# Patient Record
Sex: Female | Born: 1950
Health system: Southern US, Community
[De-identification: ages and names within clinical notes are randomized; demographics above are authoritative.]

## PROBLEM LIST (undated history)

## (undated) DIAGNOSIS — F329 Major depressive disorder, single episode, unspecified: Secondary | ICD-10-CM

## (undated) DIAGNOSIS — F32A Depression, unspecified: Secondary | ICD-10-CM

## (undated) DIAGNOSIS — I10 Essential (primary) hypertension: Secondary | ICD-10-CM

## (undated) HISTORY — DX: Depression, unspecified: F32.A

## (undated) HISTORY — PX: OVARIAN CYST REMOVAL: SHX89

## (undated) HISTORY — DX: Essential (primary) hypertension: I10

## (undated) HISTORY — PX: OTHER SURGICAL HISTORY: SHX169

## (undated) HISTORY — DX: Major depressive disorder, single episode, unspecified: F32.9

---

## 2003-03-31 ENCOUNTER — Other Ambulatory Visit: Payer: Self-pay

## 2004-10-21 ENCOUNTER — Ambulatory Visit: Payer: Self-pay | Admitting: Obstetrics and Gynecology

## 2005-08-08 ENCOUNTER — Other Ambulatory Visit: Payer: Self-pay

## 2005-08-08 ENCOUNTER — Inpatient Hospital Stay: Payer: Self-pay | Admitting: Internal Medicine

## 2005-08-20 ENCOUNTER — Ambulatory Visit: Payer: Self-pay | Admitting: Specialist

## 2005-10-06 ENCOUNTER — Ambulatory Visit: Payer: Self-pay | Admitting: Family Medicine

## 2005-10-27 ENCOUNTER — Ambulatory Visit: Payer: Self-pay | Admitting: Obstetrics and Gynecology

## 2006-10-22 ENCOUNTER — Ambulatory Visit: Payer: Self-pay | Admitting: Obstetrics and Gynecology

## 2007-10-26 ENCOUNTER — Ambulatory Visit: Payer: Self-pay | Admitting: Obstetrics and Gynecology

## 2007-12-24 ENCOUNTER — Ambulatory Visit: Payer: Self-pay | Admitting: Family Medicine

## 2008-11-13 ENCOUNTER — Ambulatory Visit: Payer: Self-pay | Admitting: Obstetrics and Gynecology

## 2009-11-15 ENCOUNTER — Ambulatory Visit: Payer: Self-pay | Admitting: Obstetrics and Gynecology

## 2010-11-21 ENCOUNTER — Ambulatory Visit: Payer: Self-pay | Admitting: Obstetrics and Gynecology

## 2011-09-27 ENCOUNTER — Ambulatory Visit: Payer: Self-pay | Admitting: Family Medicine

## 2012-04-20 ENCOUNTER — Ambulatory Visit: Payer: Self-pay | Admitting: Obstetrics and Gynecology

## 2013-03-12 ENCOUNTER — Ambulatory Visit: Payer: Self-pay | Admitting: Physician Assistant

## 2013-05-04 ENCOUNTER — Ambulatory Visit: Payer: Self-pay | Admitting: Obstetrics and Gynecology

## 2013-09-05 DIAGNOSIS — I10 Essential (primary) hypertension: Secondary | ICD-10-CM | POA: Insufficient documentation

## 2013-09-05 DIAGNOSIS — R091 Pleurisy: Secondary | ICD-10-CM | POA: Insufficient documentation

## 2014-08-24 ENCOUNTER — Other Ambulatory Visit: Payer: Self-pay

## 2014-08-24 DIAGNOSIS — F32A Depression, unspecified: Secondary | ICD-10-CM

## 2014-08-24 DIAGNOSIS — F329 Major depressive disorder, single episode, unspecified: Secondary | ICD-10-CM

## 2014-08-24 DIAGNOSIS — I10 Essential (primary) hypertension: Secondary | ICD-10-CM

## 2014-08-24 MED ORDER — HYDROCHLOROTHIAZIDE 12.5 MG PO TABS
12.5000 mg | ORAL_TABLET | Freq: Every day | ORAL | Status: DC
Start: 2014-08-24 — End: 2014-08-28

## 2014-08-24 MED ORDER — SERTRALINE HCL 50 MG PO TABS: 50.0000 mg | ORAL_TABLET | Freq: Every day | ORAL | Status: DC

## 2014-08-24 MED ORDER — AMLODIPINE BESY-BENAZEPRIL HCL 5-10 MG PO CAPS: 1.0000 | ORAL_CAPSULE | Freq: Every day | ORAL | Status: DC

## 2014-08-25 DIAGNOSIS — I10 Essential (primary) hypertension: Secondary | ICD-10-CM | POA: Insufficient documentation

## 2014-08-25 DIAGNOSIS — F329 Major depressive disorder, single episode, unspecified: Secondary | ICD-10-CM | POA: Insufficient documentation

## 2014-08-25 DIAGNOSIS — F32A Depression, unspecified: Secondary | ICD-10-CM | POA: Insufficient documentation

## 2014-08-28 ENCOUNTER — Ambulatory Visit (INDEPENDENT_AMBULATORY_CARE_PROVIDER_SITE_OTHER): Payer: BC Managed Care – PPO | Admitting: Family Medicine

## 2014-08-28 ENCOUNTER — Encounter (INDEPENDENT_AMBULATORY_CARE_PROVIDER_SITE_OTHER): Payer: Self-pay

## 2014-08-28 ENCOUNTER — Encounter: Payer: Self-pay | Admitting: Family Medicine

## 2014-08-28 VITALS — BP 130/88 | HR 80 | Ht 64.0 in | Wt 210.0 lb

## 2014-08-28 DIAGNOSIS — I10 Essential (primary) hypertension: Secondary | ICD-10-CM

## 2014-08-28 DIAGNOSIS — F32A Depression, unspecified: Secondary | ICD-10-CM

## 2014-08-28 DIAGNOSIS — F329 Major depressive disorder, single episode, unspecified: Secondary | ICD-10-CM

## 2014-08-28 DIAGNOSIS — L409 Psoriasis, unspecified: Secondary | ICD-10-CM | POA: Insufficient documentation

## 2014-08-28 DIAGNOSIS — J302 Other seasonal allergic rhinitis: Secondary | ICD-10-CM | POA: Insufficient documentation

## 2014-08-28 MED ORDER — AMLODIPINE BESY-BENAZEPRIL HCL 5-10 MG PO CAPS: 1.0000 | ORAL_CAPSULE | Freq: Every day | ORAL | Status: DC

## 2014-08-28 MED ORDER — SERTRALINE HCL 50 MG PO TABS: 50.0000 mg | ORAL_TABLET | Freq: Every day | ORAL | Status: DC

## 2014-08-28 MED ORDER — HYDROCHLOROTHIAZIDE 12.5 MG PO TABS: 12.5000 mg | ORAL_TABLET | Freq: Every day | ORAL | Status: DC

## 2014-08-28 NOTE — Progress Notes (Signed)
Name: Vanessa Alvarado   MRN: [id number]    DOB: 1950-06-25   Date:08/28/2014       Progress Note  Subjective  Chief Complaint  Chief Complaint  Patient presents with  . Hypertension    needs refills  . Depression    Hypertension This is a chronic problem. The current episode started more than 1 year ago. The problem is unchanged (controlled). The problem is controlled. Pertinent negatives include no anxiety, blurred vision, chest pain, headaches, malaise/fatigue, neck pain, orthopnea, palpitations, peripheral edema, PND, shortness of breath or sweats. There are no associated agents to hypertension. There are no known risk factors for coronary artery disease. Past treatments include ACE inhibitors, calcium channel blockers and diuretics. The current treatment provides moderate improvement. There are no compliance problems.  There is no history of angina, kidney disease, CAD/MI, CVA, heart failure, left ventricular hypertrophy, PVD or retinopathy. There is no history of chronic renal disease.  Mental Health Problem The primary symptoms do not include dysphoric mood, delusions, hallucinations, bizarre behavior, disorganized speech, negative symptoms or somatic symptoms. The current episode started more than 1 month ago. This is a chronic problem.  The degree of incapacity that she is experiencing as a consequence of her illness is mild. Additional symptoms of the illness do not include no anhedonia, no insomnia, no hypersomnia, no fatigue, no agitation, no psychomotor retardation, no feelings of worthlessness, no attention impairment, no euphoric mood, no headaches or no abdominal pain. She does not admit to suicidal ideas. She does not have a plan to commit suicide. She does not contemplate harming herself.    No problem-specific assessment & plan notes found for this encounter.   Past Medical History  Diagnosis Date  . Depression   . Hypertension     Past Surgical History  Procedure  Laterality Date  . Ovarian cyst removal    . Herniated disc      lower back    Family History  Problem Relation Age of Onset  . Heart disease Father     History   Social History  . Marital Status: Married    Spouse Name: N/A  . Number of Children: N/A  . Years of Education: N/A   Occupational History  . Not on file.   Social History Main Topics  . Smoking status: Former Games developer  . Smokeless tobacco: Not on file  . Alcohol Use: 0.0 oz/week    0 Standard drinks or equivalent per week  . Drug Use: No  . Sexual Activity: Yes   Other Topics Concern  . Not on file   Social History Narrative  . No narrative on file    No Known Allergies   Review of Systems  Constitutional: Negative for fever, chills, weight loss, malaise/fatigue and fatigue.  HENT: Negative for ear discharge, ear pain and sore throat.   Eyes: Negative for blurred vision.  Respiratory: Negative for cough, sputum production, shortness of breath and wheezing.   Cardiovascular: Negative for chest pain, palpitations, orthopnea, leg swelling and PND.  Gastrointestinal: Negative for heartburn, nausea, abdominal pain, diarrhea, constipation, blood in stool and melena.  Genitourinary: Negative for dysuria, urgency, frequency and hematuria.  Musculoskeletal: Negative for myalgias, back pain, joint pain and neck pain.  Skin: Negative for rash.  Neurological: Negative for dizziness, tingling, sensory change, focal weakness and headaches.  Endo/Heme/Allergies: Negative for environmental allergies and polydipsia. Does not bruise/bleed easily.  Psychiatric/Behavioral: Negative for depression, suicidal ideas, hallucinations, dysphoric mood and agitation. The patient  is not nervous/anxious and does not have insomnia.      Objective  Filed Vitals:   08/28/14 1513  BP: 130/88  Pulse: 80  Height:  (1.626 m)  Weight: 210 lb (95.255 kg)    Physical Exam  Constitutional: She is oriented to person, place, and  time and well-developed, well-nourished, and in no distress. No distress.  HENT:  Head: Normocephalic and atraumatic.  Right Ear: External ear normal.  Left Ear: External ear normal.  Nose: Nose normal.  Mouth/Throat: Oropharynx is clear and moist.  Eyes: Conjunctivae and EOM are normal. Pupils are equal, round, and reactive to light.  Neck: Normal range of motion. Neck supple. No JVD present. No thyromegaly present.  Cardiovascular: Normal rate, regular rhythm, normal heart sounds and intact distal pulses.   No murmur heard. Pulmonary/Chest: Effort normal and breath sounds normal.  Abdominal: Soft. Bowel sounds are normal. There is no tenderness.  Musculoskeletal: Normal range of motion. She exhibits no tenderness.  Lymphadenopathy:    She has no cervical adenopathy.  Neurological: She is alert and oriented to person, place, and time. She has normal reflexes.  Skin: Skin is warm and dry. She is not diaphoretic.  Psychiatric: Mood and affect normal.      No results found for this or any previous visit (from the past 2160 hour(s)).   Assessment & Plan  Problem List Items Addressed This Visit      Cardiovascular and Mediastinum   BP (high blood pressure) - Primary   Relevant Medications   amLODipine-benazepril (LOTREL) 5-10 MG per capsule   hydrochlorothiazide (HYDRODIURIL) 12.5 MG tablet   Other Relevant Orders   Renal Function Panel     Other   Clinical depression   Relevant Medications   sertraline (ZOLOFT) 50 MG tablet    Other Visit Diagnoses    Depression        Relevant Medications    sertraline (ZOLOFT) 50 MG tablet         Dr. [facility]  08/28/2014

## 2014-08-29 LAB — RENAL FUNCTION PANEL
ALBUMIN: 4.5 g/dL (ref 3.6–4.8)
BUN/Creatinine Ratio: 13 (ref 11–26)
BUN: 11 mg/dL (ref 8–27)
CO2: 27 mmol/L (ref 18–29)
CREATININE: 0.88 mg/dL (ref 0.57–1.00)
Calcium: 9.9 mg/dL (ref 8.7–10.3)
Chloride: 97 mmol/L (ref 97–108)
GFR calc non Af Amer: 71 mL/min/{1.73_m2} (ref >59)
GFR, EST AFRICAN AMERICAN: 82 mL/min/{1.73_m2} (ref >59)
GLUCOSE: 127 mg/dL — AB (ref 65–99)
POTASSIUM: 4.2 mmol/L (ref 3.5–5.2)
Phosphorus: 3.1 mg/dL (ref 2.5–4.5)
Sodium: 140 mmol/L (ref 134–144)

## 2014-09-27 ENCOUNTER — Other Ambulatory Visit: Payer: Self-pay | Admitting: Obstetrics and Gynecology

## 2014-09-27 DIAGNOSIS — Z1231 Encounter for screening mammogram for malignant neoplasm of breast: Secondary | ICD-10-CM

## 2014-10-03 ENCOUNTER — Ambulatory Visit
Admission: RE | Admit: 2014-10-03 | Discharge: 2014-10-03 | Disposition: A | Discharge status: Home / Self Care | Payer: BC Managed Care – PPO | Source: Ambulatory Visit | Attending: Obstetrics and Gynecology | Admitting: Obstetrics and Gynecology

## 2014-10-03 DIAGNOSIS — Z1231 Encounter for screening mammogram for malignant neoplasm of breast: Secondary | ICD-10-CM | POA: Insufficient documentation

## 2015-01-06 ENCOUNTER — Ambulatory Visit
Admission: EM | Admit: 2015-01-06 | Discharge: 2015-01-06 | Disposition: A | Discharge status: Home / Self Care | Payer: BC Managed Care – PPO | Attending: Emergency Medicine | Admitting: Emergency Medicine

## 2015-01-06 ENCOUNTER — Encounter: Payer: Self-pay | Admitting: Gynecology

## 2015-01-06 DIAGNOSIS — L250 Unspecified contact dermatitis due to cosmetics: Secondary | ICD-10-CM | POA: Diagnosis not present

## 2015-01-06 MED ORDER — PREDNISONE 50 MG PO TABS: 50.0000 mg | ORAL_TABLET | Freq: Every day | ORAL | Status: DC

## 2015-01-06 NOTE — ED Notes (Signed)
Patient c/o allergic reaction to make up. Per patient used make up on face which she purchase from [employer] x 3 days. Per pt. Face redness/ itching and swelling.

## 2015-01-06 NOTE — ED Provider Notes (Signed)
HPI  SUBJECTIVE:  Vanessa Alvarado is a 64 y.o. female who presents with burning sensation, erythema, swelling or any fever eyes and along her cheeks for the past several days. She states that her face itches. She states that the symptoms started after trying a new Foundation 3 days ago. She did not wear any yesterday. Also reports some erythema and mild itching along her upper chest. Symptoms are worse with applying a night cream.. Symptoms are better with Benadryl. She tried Benadryl 50 mg every 4 hours, Zyrtec, cool compresses, night cream. No difficulty breathing, lip swelling, tongue swelling, wheezing, shortness of breath, hives, abdominal pain, diarrhea. No other new lotions, soaps, detergents, foods, change in medications. She states that she uses Cipro water when she cleans her face. She has a past medical history of eczema, hypertension. No history of asthma, diabetes.  Past Medical History  Diagnosis Date  . Depression   . Hypertension     Past Surgical History  Procedure Laterality Date  . Ovarian cyst removal    . Herniated disc      lower back    Family History  Problem Relation Age of Onset  . Heart disease Father     Social History  Substance Use Topics  . Smoking status: Former Games developer  . Smokeless tobacco: None  . Alcohol Use: 0.0 oz/week    0 Standard drinks or equivalent per week    No current facility-administered medications for this encounter.  Current outpatient prescriptions:  .  amLODipine-benazepril (LOTREL) 5-10 MG per capsule, Take 1 capsule by mouth daily., Disp: 30 capsule, Rfl: 6 .  hydrochlorothiazide (HYDRODIURIL) 12.5 MG tablet, Take 1 tablet (12.5 mg total) by mouth daily., Disp: 30 tablet, Rfl: 6 .  sertraline (ZOLOFT) 50 MG tablet, Take 1 tablet (50 mg total) by mouth daily., Disp: 30 tablet, Rfl: 6 .  predniSONE (DELTASONE) 50 MG tablet, Take 1 tablet (50 mg total) by mouth daily with breakfast., Disp: 5 tablet, Rfl: 0  No Known  Allergies   ROS  As noted in HPI.   Physical Exam  BP 137/69 mmHg  Pulse 60  Temp(Src) 98.2 F (36.8 C) (Oral)  Resp 18  Ht  (1.626 m)  Wt 205 lb (92.987 kg)  BMI 35.17 kg/m2  SpO2 98%  Constitutional: Well developed, well nourished, no acute distress Eyes:  EOMI, conjunctiva normal bilaterally HENT: Normocephalic, atraumatic,mucus membranes moist airway widely patent Respiratory: Normal inspiratory effort lungs clear bilaterally good air movement Cardiovascular: Normal rate regular rhythm no murmurs rubs gallops GI: nondistended  skin: Diffuse blanchable erythema along the entire face, mild puffiness inferior to her eyes. No angioedema. No urticaria over her entire body.  No crusting, vesicles. Mild blanchable erythema over chest. Musculoskeletal: no deformities Neurologic: Alert & oriented x 3, no focal neuro deficits Psychiatric: Speech and behavior appropriate   ED Course   Medications - No data to display  No orders of the defined types were placed in this encounter.    No results found for this or any previous visit (from the past 24 hour(s)). No results found.  ED Clinical Impression  Contact dermatitis due to cosmetics   ED Assessment/Plan Presentation most consistent with contact dermatitis. We'll send home with oral steroids as she has extensive erythema and some facial swelling. No evidence of airway involvement, no evidence of anaphylaxis.Marland Kitchen 5 days of prednisone 50 mg. Advised patient to do cool compresses, limit the amount of lotions and soaps that she puts on her  face for the next several days. Advised her to stay out of the sun. Follow-up with primary care physician as needed. Return if gets worse. Patient agrees with plan.  *This clinic note was created using Dragon dictation software. Therefore, there may be occasional mistakes despite careful proofreading.  ?   Domenick GongAshley Sahvannah Rieser, MD 01/06/15 1715

## 2015-01-06 NOTE — Discharge Instructions (Signed)
Continue the Zyrtec, Benadryl at night. Cool compresses to your face. Use lukewarm water when you wash your face. You may try Aquaphor or another non-comedogenic, hypoallergenic lotion if your skin becomes very dry. Return for any signs of infection, if your symptoms get worse, or for other concerns.

## 2015-05-11 ENCOUNTER — Other Ambulatory Visit: Payer: Self-pay | Admitting: Family Medicine

## 2015-05-11 ENCOUNTER — Other Ambulatory Visit: Payer: Self-pay

## 2015-05-11 DIAGNOSIS — I10 Essential (primary) hypertension: Secondary | ICD-10-CM

## 2015-05-11 DIAGNOSIS — F32A Depression, unspecified: Secondary | ICD-10-CM

## 2015-05-11 DIAGNOSIS — F329 Major depressive disorder, single episode, unspecified: Secondary | ICD-10-CM

## 2015-05-11 MED ORDER — SERTRALINE HCL 50 MG PO TABS
50.0000 mg | ORAL_TABLET | Freq: Every day | ORAL | Status: DC
Start: 2015-05-11 — End: 2015-06-01

## 2015-05-11 MED ORDER — AMLODIPINE BESY-BENAZEPRIL HCL 5-10 MG PO CAPS: 1.0000 | ORAL_CAPSULE | Freq: Every day | ORAL | Status: DC

## 2015-05-11 MED ORDER — HYDROCHLOROTHIAZIDE 12.5 MG PO TABS: 12.5000 mg | ORAL_TABLET | Freq: Every day | ORAL | Status: DC

## 2015-06-01 ENCOUNTER — Encounter: Payer: Self-pay | Admitting: Family Medicine

## 2015-06-01 ENCOUNTER — Ambulatory Visit (INDEPENDENT_AMBULATORY_CARE_PROVIDER_SITE_OTHER): Payer: BC Managed Care – PPO | Admitting: Family Medicine

## 2015-06-01 VITALS — BP 124/78 | HR 76 | Ht 64.0 in | Wt 208.0 lb

## 2015-06-01 DIAGNOSIS — I10 Essential (primary) hypertension: Secondary | ICD-10-CM

## 2015-06-01 DIAGNOSIS — F329 Major depressive disorder, single episode, unspecified: Secondary | ICD-10-CM

## 2015-06-01 DIAGNOSIS — F32A Depression, unspecified: Secondary | ICD-10-CM

## 2015-06-01 DIAGNOSIS — E663 Overweight: Secondary | ICD-10-CM | POA: Diagnosis not present

## 2015-06-01 MED ORDER — HYDROCHLOROTHIAZIDE 12.5 MG PO TABS: 12.5000 mg | ORAL_TABLET | Freq: Every day | ORAL | Status: DC

## 2015-06-01 MED ORDER — AMLODIPINE BESY-BENAZEPRIL HCL 5-10 MG PO CAPS: 1.0000 | ORAL_CAPSULE | Freq: Every day | ORAL | Status: DC

## 2015-06-01 MED ORDER — SERTRALINE HCL 50 MG PO TABS: 50.0000 mg | ORAL_TABLET | Freq: Every day | ORAL | Status: DC

## 2015-06-01 NOTE — Progress Notes (Signed)
Name: Vanessa CongKathryn A Alvarado   MRN: [id number]    DOB: 12/05/1950   Date:06/01/2015       Progress Note  Subjective  Chief Complaint  Chief Complaint  Patient presents with  . Follow-up  . Hypertension    Hypertension This is a chronic problem. The current episode started more than 1 year ago. The problem has been waxing and waning since onset. The problem is controlled. Pertinent negatives include no anxiety, blurred vision, chest pain, headaches, malaise/fatigue, neck pain, orthopnea, palpitations, peripheral edema, PND, shortness of breath or sweats. Risk factors for coronary artery disease include dyslipidemia and post-menopausal state. Past treatments include diuretics, calcium channel blockers and ACE inhibitors. The current treatment provides mild improvement. There are no compliance problems.  There is no history of angina, kidney disease, CAD/MI, CVA, heart failure, left ventricular hypertrophy, PVD, renovascular disease or retinopathy. There is no history of chronic renal disease or a hypertension causing med.  Depression        This is a chronic problem.  The problem has been gradually improving since onset.  Associated symptoms include no decreased concentration, no helplessness, no hopelessness, does not have insomnia, not irritable, no decreased interest, no myalgias, no headaches and no suicidal ideas.  Past treatments include SSRIs - Selective serotonin reuptake inhibitors.  Compliance with treatment is good.   Pertinent negatives include no anxiety.   No problem-specific assessment & plan notes found for this encounter.   Past Medical History  Diagnosis Date  . Depression   . Hypertension     Past Surgical History  Procedure Laterality Date  . Ovarian cyst removal    . Herniated disc      lower back    Family History  Problem Relation Age of Onset  . Heart disease Father     Social History   Social History  . Marital Status: Married    Spouse Name: N/A  . Number  of Children: N/A  . Years of Education: N/A   Occupational History  . Not on file.   Social History Main Topics  . Smoking status: Former Games developermoker  . Smokeless tobacco: Not on file  . Alcohol Use: 0.0 oz/week    0 Standard drinks or equivalent per week  . Drug Use: No  . Sexual Activity: Yes   Other Topics Concern  . Not on file   Social History Narrative    No Known Allergies   Review of Systems  Constitutional: Negative for fever, chills, weight loss and malaise/fatigue.  HENT: Negative for ear discharge, ear pain and sore throat.   Eyes: Negative for blurred vision.  Respiratory: Negative for cough, sputum production, shortness of breath and wheezing.   Cardiovascular: Negative for chest pain, palpitations, orthopnea, leg swelling and PND.  Gastrointestinal: Negative for heartburn, nausea, abdominal pain, diarrhea, constipation, blood in stool and melena.  Genitourinary: Negative for dysuria, urgency, frequency and hematuria.  Musculoskeletal: Negative for myalgias, back pain, joint pain and neck pain.  Skin: Negative for rash.  Neurological: Negative for dizziness, tingling, sensory change, focal weakness and headaches.  Endo/Heme/Allergies: Negative for environmental allergies and polydipsia. Does not bruise/bleed easily.  Psychiatric/Behavioral: Positive for depression. Negative for suicidal ideas and decreased concentration. The patient is not nervous/anxious and does not have insomnia.      Objective  Filed Vitals:   06/01/15 0918  BP: 124/78  Pulse: 76  Height: 5\' 4"  (1.626 m)  Weight: 208 lb (94.348 kg)    Physical Exam  Constitutional:  She is well-developed, well-nourished, and in no distress. She is not irritable. No distress.  HENT:  Head: Normocephalic and atraumatic.  Right Ear: External ear normal.  Left Ear: External ear normal.  Nose: Nose normal.  Mouth/Throat: Oropharynx is clear and moist.  Eyes: Conjunctivae and EOM are normal. Pupils are  equal, round, and reactive to light. Right eye exhibits no discharge. Left eye exhibits no discharge.  Neck: Normal range of motion. Neck supple. No JVD present. No thyromegaly present.  Cardiovascular: Normal rate, regular rhythm, normal heart sounds and intact distal pulses.  Exam reveals no gallop and no friction rub.   No murmur heard. Pulmonary/Chest: Effort normal and breath sounds normal.  Abdominal: Soft. Bowel sounds are normal. She exhibits no mass. There is no tenderness. There is no guarding.  Musculoskeletal: Normal range of motion. She exhibits no edema.  Lymphadenopathy:    She has no cervical adenopathy.  Neurological: She is alert. She has normal reflexes.  Skin: Skin is warm and dry. She is not diaphoretic.  Psychiatric: Mood and affect normal.  Nursing note and vitals reviewed.     Assessment & Plan  Problem List Items Addressed This Visit      Cardiovascular and Mediastinum   BP (high blood pressure) - Primary   Relevant Medications   hydrochlorothiazide (HYDRODIURIL) 12.5 MG tablet   amLODipine-benazepril (LOTREL) 5-10 MG capsule   Other Relevant Orders   Renal Function Panel     Other   Clinical depression   Relevant Medications   sertraline (ZOLOFT) 50 MG tablet    Other Visit Diagnoses    Depression        Relevant Medications    sertraline (ZOLOFT) 50 MG tablet    Overweight        Relevant Orders    Lipid Profile         Dr. Elizabeth Sauer [facility]  06/01/2015

## 2015-06-02 LAB — RENAL FUNCTION PANEL
Albumin: 5 g/dL — ABNORMAL HIGH (ref 3.6–4.8)
BUN/Creatinine Ratio: 22 (ref 11–26)
BUN: 18 mg/dL (ref 8–27)
CALCIUM: 10.6 mg/dL — AB (ref 8.7–10.3)
CHLORIDE: 96 mmol/L (ref 96–106)
CO2: 27 mmol/L (ref 18–29)
CREATININE: 0.81 mg/dL (ref 0.57–1.00)
GFR calc Af Amer: 90 mL/min/{1.73_m2} (ref >59)
GFR calc non Af Amer: 78 mL/min/{1.73_m2} (ref >59)
Glucose: 97 mg/dL (ref 65–99)
PHOSPHORUS: 3.8 mg/dL (ref 2.5–4.5)
Potassium: 4.7 mmol/L (ref 3.5–5.2)
SODIUM: 142 mmol/L (ref 134–144)

## 2015-06-02 LAB — LIPID PANEL
CHOLESTEROL TOTAL: 216 mg/dL — AB (ref 100–199)
Chol/HDL Ratio: 4 ratio units (ref 0.0–4.4)
HDL: 54 mg/dL (ref >39)
LDL Calculated: 119 mg/dL — ABNORMAL HIGH (ref 0–99)
Triglycerides: 215 mg/dL — ABNORMAL HIGH (ref 0–149)
VLDL Cholesterol Cal: 43 mg/dL — ABNORMAL HIGH (ref 5–40)

## 2015-06-14 ENCOUNTER — Ambulatory Visit (INDEPENDENT_AMBULATORY_CARE_PROVIDER_SITE_OTHER): Payer: BC Managed Care – PPO | Admitting: Family Medicine

## 2015-06-14 ENCOUNTER — Encounter: Payer: Self-pay | Admitting: Family Medicine

## 2015-06-14 VITALS — BP 112/80 | HR 64 | Temp 98.0°F | Ht 64.0 in | Wt 208.0 lb

## 2015-06-14 DIAGNOSIS — J40 Bronchitis, not specified as acute or chronic: Secondary | ICD-10-CM

## 2015-06-14 DIAGNOSIS — J0100 Acute maxillary sinusitis, unspecified: Secondary | ICD-10-CM | POA: Diagnosis not present

## 2015-06-14 MED ORDER — GUAIFENESIN-CODEINE 100-10 MG/5ML PO SYRP: 5.0000 mL | ORAL_SOLUTION | Freq: Three times a day (TID) | ORAL | Status: DC | PRN

## 2015-06-14 MED ORDER — AMOXICILLIN-POT CLAVULANATE 875-125 MG PO TABS: 1.0000 | ORAL_TABLET | Freq: Two times a day (BID) | ORAL | Status: DC

## 2015-06-14 NOTE — Progress Notes (Signed)
Name: Vanessa CongKathryn A Alvarado   MRN: [id number]    DOB: Jan 07, 1951   Date:06/14/2015       Progress Note  Subjective  Chief Complaint  Chief Complaint  Patient presents with  . Sinusitis    tickle in throat, fever 101.6, cough    Sinusitis This is a new problem. The current episode started in the past 7 days. The problem has been waxing and waning since onset. The maximum temperature recorded prior to her arrival was 101 - 101.9 F. She is experiencing no pain. Associated symptoms include congestion, coughing, a hoarse voice, shortness of breath, sinus pressure and sneezing. Pertinent negatives include no chills, ear pain, headaches, neck pain or sore throat. Past treatments include acetaminophen and oral decongestants. The treatment provided no relief.  Cough This is a new problem. The problem has been gradually improving. The cough is non-productive. Associated symptoms include nasal congestion, postnasal drip and shortness of breath. Pertinent negatives include no chest pain, chills, ear pain, fever, headaches, heartburn, myalgias, rash, sore throat, weight loss or wheezing. There is no history of environmental allergies.    No problem-specific assessment & plan notes found for this encounter.   Past Medical History  Diagnosis Date  . Depression   . Hypertension     Past Surgical History  Procedure Laterality Date  . Ovarian cyst removal    . Herniated disc      lower back    Family History  Problem Relation Age of Onset  . Heart disease Father     Social History   Social History  . Marital Status: Married    Spouse Name: N/A  . Number of Children: N/A  . Years of Education: N/A   Occupational History  . Not on file.   Social History Main Topics  . Smoking status: Former Games developermoker  . Smokeless tobacco: Not on file  . Alcohol Use: 0.0 oz/week    0 Standard drinks or equivalent per week  . Drug Use: No  . Sexual Activity: Yes   Other Topics Concern  . Not on file    Social History Narrative    No Known Allergies   Review of Systems  Constitutional: Negative for fever, chills, weight loss and malaise/fatigue.  HENT: Positive for congestion, hoarse voice, postnasal drip, sinus pressure and sneezing. Negative for ear discharge, ear pain and sore throat.   Eyes: Negative for blurred vision.  Respiratory: Positive for cough and shortness of breath. Negative for sputum production and wheezing.   Cardiovascular: Negative for chest pain, palpitations and leg swelling.  Gastrointestinal: Negative for heartburn, nausea, abdominal pain, diarrhea, constipation, blood in stool and melena.  Genitourinary: Negative for dysuria, urgency, frequency and hematuria.  Musculoskeletal: Negative for myalgias, back pain, joint pain and neck pain.  Skin: Negative for rash.  Neurological: Negative for dizziness, tingling, sensory change, focal weakness and headaches.  Endo/Heme/Allergies: Negative for environmental allergies and polydipsia. Does not bruise/bleed easily.  Psychiatric/Behavioral: Negative for depression and suicidal ideas. The patient is not nervous/anxious and does not have insomnia.      Objective  Filed Vitals:   06/14/15 1600  BP: 112/80  Pulse: 64  Temp: 98 F (36.7 C)  TempSrc: Oral  Height: 5\' 4"  (1.626 m)  Weight: 208 lb (94.348 kg)    Physical Exam  Constitutional: She is well-developed, well-nourished, and in no distress. No distress.  HENT:  Head: Normocephalic and atraumatic.  Right Ear: Tympanic membrane, external ear and ear canal normal.  Left  Ear: Tympanic membrane, external ear and ear canal normal.  Nose: Nose normal.  Mouth/Throat: Oropharynx is clear and moist.  Eyes: Conjunctivae and EOM are normal. Pupils are equal, round, and reactive to light. Right eye exhibits no discharge. Left eye exhibits no discharge.  Neck: Normal range of motion. Neck supple. No JVD present. No thyromegaly present.  Cardiovascular: Normal  rate, regular rhythm, normal heart sounds and intact distal pulses.  Exam reveals no gallop and no friction rub.   No murmur heard. Pulmonary/Chest: Effort normal and breath sounds normal.  Abdominal: Soft. Bowel sounds are normal. She exhibits no mass. There is no tenderness. There is no guarding.  Musculoskeletal: Normal range of motion. She exhibits no edema.  Lymphadenopathy:    She has no cervical adenopathy.  Neurological: She is alert.  Skin: Skin is warm and dry. She is not diaphoretic.  Psychiatric: Mood and affect normal.      Assessment & Plan  Problem List Items Addressed This Visit    None    Visit Diagnoses    Acute maxillary sinusitis, recurrence not specified    -  Primary    Relevant Medications    amoxicillin-clavulanate (AUGMENTIN) 875-125 MG tablet    guaiFENesin-codeine (ROBITUSSIN AC) 100-10 MG/5ML syrup    Bronchitis        Relevant Medications    amoxicillin-clavulanate (AUGMENTIN) 875-125 MG tablet    guaiFENesin-codeine (ROBITUSSIN AC) 100-10 MG/5ML syrup         Dr. [facility]  06/14/2015

## 2015-11-23 ENCOUNTER — Other Ambulatory Visit: Payer: Self-pay | Admitting: Obstetrics and Gynecology

## 2015-12-06 ENCOUNTER — Other Ambulatory Visit: Payer: Self-pay | Admitting: Obstetrics and Gynecology

## 2015-12-06 DIAGNOSIS — Z1231 Encounter for screening mammogram for malignant neoplasm of breast: Secondary | ICD-10-CM

## 2015-12-12 ENCOUNTER — Other Ambulatory Visit: Payer: Self-pay | Admitting: Obstetrics and Gynecology

## 2015-12-12 ENCOUNTER — Ambulatory Visit
Admission: RE | Admit: 2015-12-12 | Discharge: 2015-12-12 | Disposition: A | Discharge status: Home / Self Care | Payer: BC Managed Care – PPO | Source: Ambulatory Visit | Attending: Obstetrics and Gynecology | Admitting: Obstetrics and Gynecology

## 2015-12-12 DIAGNOSIS — Z1231 Encounter for screening mammogram for malignant neoplasm of breast: Secondary | ICD-10-CM

## 2016-01-21 ENCOUNTER — Encounter: Payer: Self-pay | Admitting: Family Medicine

## 2016-01-21 ENCOUNTER — Ambulatory Visit (INDEPENDENT_AMBULATORY_CARE_PROVIDER_SITE_OTHER): Payer: BC Managed Care – PPO | Admitting: Family Medicine

## 2016-01-21 VITALS — BP 120/70 | HR 74 | Ht 64.0 in | Wt 206.0 lb

## 2016-01-21 DIAGNOSIS — Z23 Encounter for immunization: Secondary | ICD-10-CM

## 2016-01-21 DIAGNOSIS — I10 Essential (primary) hypertension: Secondary | ICD-10-CM

## 2016-01-21 DIAGNOSIS — F3342 Major depressive disorder, recurrent, in full remission: Secondary | ICD-10-CM

## 2016-01-21 MED ORDER — HYDROCHLOROTHIAZIDE 12.5 MG PO TABS: 12.5000 mg | ORAL_TABLET | Freq: Every day | ORAL | 6 refills | Status: DC

## 2016-01-21 MED ORDER — SERTRALINE HCL 50 MG PO TABS: 50.0000 mg | ORAL_TABLET | Freq: Every day | ORAL | 6 refills | Status: DC

## 2016-01-21 MED ORDER — AMLODIPINE BESY-BENAZEPRIL HCL 5-10 MG PO CAPS: 1.0000 | ORAL_CAPSULE | Freq: Every day | ORAL | 6 refills | Status: DC

## 2016-01-21 NOTE — Progress Notes (Signed)
Name: Vanessa Alvarado   MRN: [id number]    DOB: 28-Mar-1950   Date:01/21/2016       Progress Note  Subjective  Chief Complaint  Chief Complaint  Patient presents with  . Hypertension    30 days at a time  . Depression    Hypertension  This is a chronic problem. The current episode started more than 1 year ago. The problem has been waxing and waning since onset. The problem is controlled. Pertinent negatives include no anxiety, blurred vision, chest pain, headaches, malaise/fatigue, neck pain, orthopnea, palpitations, peripheral edema, PND, shortness of breath or sweats. There are no associated agents to hypertension. Risk factors for coronary artery disease include obesity, post-menopausal state, stress and dyslipidemia. Past treatments include ACE inhibitors, calcium channel blockers and diuretics. The current treatment provides mild improvement. Compliance problems include diet.  There is no history of angina, kidney disease, CAD/MI, CVA, heart failure, left ventricular hypertrophy, PVD, renovascular disease or retinopathy. There is no history of chronic renal disease or a hypertension causing med.  Depression         This is a chronic problem.  The current episode started more than 1 year ago.   The onset quality is undetermined.   The problem occurs rarely.  The problem has been gradually improving since onset.  Associated symptoms include no decreased concentration, no fatigue, no helplessness, no hopelessness, does not have insomnia, not irritable, no restlessness, no decreased interest, no appetite change, no body aches, no myalgias, no headaches, no indigestion, not sad and no suicidal ideas.     The symptoms are aggravated by family issues.  Past treatments include SSRIs - Selective serotonin reuptake inhibitors.  Compliance with treatment is good.  Past compliance problems: patient desires to trial off medication.  Previous treatment provided moderate relief.   Pertinent negatives include no  anxiety.   No problem-specific Assessment & Plan notes found for this encounter.   Past Medical History:  Diagnosis Date  . Depression   . Hypertension     Past Surgical History:  Procedure Laterality Date  . herniated disc     lower back  . OVARIAN CYST REMOVAL      Family History  Problem Relation Age of Onset  . Heart disease Father   . Breast cancer Neg Hx     Social History   Social History  . Marital status: Married    Spouse name: N/A  . Number of children: N/A  . Years of education: N/A   Occupational History  . Not on file.   Social History Main Topics  . Smoking status: Former Games developermoker  . Smokeless tobacco: Not on file  . Alcohol use 0.0 oz/week  . Drug use: No  . Sexual activity: Yes   Other Topics Concern  . Not on file   Social History Narrative  . No narrative on file    No Known Allergies   Review of Systems  Constitutional: Negative for appetite change, chills, fatigue, fever, malaise/fatigue and weight loss.  HENT: Negative for ear discharge, ear pain and sore throat.   Eyes: Negative for blurred vision.  Respiratory: Negative for cough, sputum production, shortness of breath and wheezing.   Cardiovascular: Negative for chest pain, palpitations, orthopnea, leg swelling and PND.  Gastrointestinal: Negative for abdominal pain, blood in stool, constipation, diarrhea, heartburn, melena and nausea.  Genitourinary: Negative for dysuria, frequency, hematuria and urgency.  Musculoskeletal: Negative for back pain, joint pain, myalgias and neck pain.  Skin:  Negative for rash.  Neurological: Negative for dizziness, tingling, sensory change, focal weakness and headaches.  Endo/Heme/Allergies: Negative for environmental allergies and polydipsia. Does not bruise/bleed easily.  Psychiatric/Behavioral: Positive for depression. Negative for decreased concentration and suicidal ideas. The patient is not nervous/anxious and does not have insomnia.       Objective  Vitals:   01/21/16 0835  BP: 120/70  Pulse: 74  Weight: 206 lb (93.4 kg)  Height: 5\' 4"  (1.626 m)    Physical Exam  Constitutional: She is well-developed, well-nourished, and in no distress. She is not irritable. No distress.  HENT:  Head: Normocephalic and atraumatic.  Right Ear: Tympanic membrane, external ear and ear canal normal.  Left Ear: Tympanic membrane, external ear and ear canal normal.  Nose: Nose normal.  Mouth/Throat: Uvula is midline, oropharynx is clear and moist and mucous membranes are normal.  Eyes: Conjunctivae and EOM are normal. Pupils are equal, round, and reactive to light. Right eye exhibits no discharge. Left eye exhibits no discharge.  Neck: Normal range of motion. Neck supple. No JVD present. No thyromegaly present.  Cardiovascular: Normal rate, regular rhythm, normal heart sounds and intact distal pulses.  Exam reveals no gallop and no friction rub.   No murmur heard. Pulmonary/Chest: Effort normal and breath sounds normal.  Abdominal: Soft. Bowel sounds are normal. She exhibits no mass. There is no tenderness. There is no guarding.  Musculoskeletal: Normal range of motion. She exhibits no edema.  Lymphadenopathy:    She has no cervical adenopathy.  Neurological: She is alert. She has normal reflexes.  Skin: Skin is warm and dry. She is not diaphoretic.  Psychiatric: Mood and affect normal.  Nursing note and vitals reviewed.     Assessment & Plan  Problem List Items Addressed This Visit      Cardiovascular and Mediastinum   BP (high blood pressure)   Relevant Medications   amLODipine-benazepril (LOTREL) 5-10 MG capsule   hydrochlorothiazide (HYDRODIURIL) 12.5 MG tablet   Essential (primary) hypertension - Primary   Relevant Medications   amLODipine-benazepril (LOTREL) 5-10 MG capsule   hydrochlorothiazide (HYDRODIURIL) 12.5 MG tablet   Other Relevant Orders   Renal Function Panel     Other   Clinical depression    Relevant Medications   sertraline (ZOLOFT) 50 MG tablet    Other Visit Diagnoses    Immunization due       Relevant Orders   Tdap vaccine greater than or equal to 7yo IM (Completed)     I spent 25 minutes with this patient, More than 50% of that time was spent in face to face education, counseling and care coordination.  Dr. Hayden Rasmusseneanna Jones [facility]  01/21/16

## 2016-01-22 LAB — RENAL FUNCTION PANEL
Albumin: 4.6 g/dL (ref 3.6–4.8)
BUN/Creatinine Ratio: 23 (ref 12–28)
BUN: 17 mg/dL (ref 8–27)
CO2: 27 mmol/L (ref 18–29)
CREATININE: 0.75 mg/dL (ref 0.57–1.00)
Calcium: 9.9 mg/dL (ref 8.7–10.3)
Chloride: 98 mmol/L (ref 96–106)
GFR calc Af Amer: 98 mL/min/{1.73_m2} (ref >59)
GFR, EST NON AFRICAN AMERICAN: 85 mL/min/{1.73_m2} (ref >59)
Glucose: 87 mg/dL (ref 65–99)
PHOSPHORUS: 3.5 mg/dL (ref 2.5–4.5)
Potassium: 4.3 mmol/L (ref 3.5–5.2)
SODIUM: 142 mmol/L (ref 134–144)

## 2016-07-21 ENCOUNTER — Encounter: Payer: Self-pay | Admitting: Family Medicine

## 2016-07-21 ENCOUNTER — Ambulatory Visit (INDEPENDENT_AMBULATORY_CARE_PROVIDER_SITE_OTHER): Payer: BC Managed Care – PPO | Admitting: Family Medicine

## 2016-07-21 DIAGNOSIS — I10 Essential (primary) hypertension: Secondary | ICD-10-CM | POA: Diagnosis not present

## 2016-07-21 MED ORDER — HYDROCHLOROTHIAZIDE 12.5 MG PO TABS: 12.5000 mg | ORAL_TABLET | Freq: Every day | ORAL | 6 refills | Status: DC

## 2016-07-21 MED ORDER — AMLODIPINE BESY-BENAZEPRIL HCL 5-10 MG PO CAPS: 1.0000 | ORAL_CAPSULE | Freq: Every day | ORAL | 6 refills | Status: DC

## 2016-07-21 NOTE — Progress Notes (Signed)
Name: Vanessa Alvarado   MRN: [id number]    DOB: Mar 06, 1951   Date:07/21/2016       Progress Note  Subjective  Chief Complaint  Chief Complaint  Patient presents with  . Hypertension    Hypertension  This is a chronic problem. The current episode started more than 1 year ago. The problem has been rapidly improving since onset. The problem is controlled. Pertinent negatives include no anxiety, blurred vision, chest pain, headaches, malaise/fatigue, neck pain, orthopnea, palpitations, peripheral edema, PND, shortness of breath or sweats. There are no associated agents to hypertension. Risk factors for coronary artery disease include obesity and dyslipidemia. Past treatments include ACE inhibitors, diuretics and calcium channel blockers. The current treatment provides moderate improvement. There are no compliance problems.  There is no history of angina, kidney disease, CAD/MI, CVA, heart failure, left ventricular hypertrophy, PVD or retinopathy. There is no history of chronic renal disease, a hypertension causing med or renovascular disease.  Depression         The patient presents with no depression.  This is a chronic problem.  The onset quality is gradual.   The problem occurs rarely.  The problem has been gradually improving since onset.  Associated symptoms include no decreased concentration, no fatigue, no helplessness, no hopelessness, does not have insomnia, not irritable, no restlessness, no decreased interest, no appetite change, no body aches, no myalgias, no headaches, no indigestion, not sad and no suicidal ideas.     The symptoms are aggravated by nothing.  Past treatments include SSRIs - Selective serotonin reuptake inhibitors.  Compliance with treatment is good.  Previous treatment provided significant (Patient has gradually weaned) relief.   Pertinent negatives include no chronic fatigue syndrome, no chronic pain, no anxiety and no depression.   No problem-specific Assessment & Plan  notes found for this encounter.   Past Medical History:  Diagnosis Date  . Depression   . Hypertension     Past Surgical History:  Procedure Laterality Date  . herniated disc     lower back  . OVARIAN CYST REMOVAL      Family History  Problem Relation Age of Onset  . Heart disease Father   . Breast cancer Neg Hx     Social History   Social History  . Marital status: Married    Spouse name: N/A  . Number of children: N/A  . Years of education: N/A   Occupational History  . Not on file.   Social History Main Topics  . Smoking status: Former Games developermoker  . Smokeless tobacco: Never Used  . Alcohol use 0.0 oz/week  . Drug use: No  . Sexual activity: Yes   Other Topics Concern  . Not on file   Social History Narrative  . No narrative on file    No Known Allergies  Outpatient Medications Prior to Visit  Medication Sig Dispense Refill  . cetirizine (ZYRTEC) 10 MG tablet Take 10 mg by mouth daily.    Marland Kitchen. amLODipine-benazepril (LOTREL) 5-10 MG capsule Take 1 capsule by mouth daily. 30 capsule 6  . hydrochlorothiazide (HYDRODIURIL) 12.5 MG tablet Take 1 tablet (12.5 mg total) by mouth daily. 30 tablet 6  . sertraline (ZOLOFT) 50 MG tablet Take 1 tablet (50 mg total) by mouth daily. Will taper off /one half q day for 30days 30 tablet 6   No facility-administered medications prior to visit.     Review of Systems  Constitutional: Negative for appetite change, chills, fatigue, fever, malaise/fatigue and  weight loss.  HENT: Negative for ear discharge, ear pain and sore throat.   Eyes: Negative for blurred vision.  Respiratory: Negative for cough, sputum production, shortness of breath and wheezing.   Cardiovascular: Negative for chest pain, palpitations, orthopnea, leg swelling and PND.  Gastrointestinal: Negative for abdominal pain, blood in stool, constipation, diarrhea, heartburn, melena and nausea.  Genitourinary: Negative for dysuria, frequency, hematuria and urgency.   Musculoskeletal: Negative for back pain, joint pain, myalgias and neck pain.  Skin: Negative for rash.  Neurological: Negative for dizziness, tingling, sensory change, focal weakness and headaches.  Endo/Heme/Allergies: Negative for environmental allergies and polydipsia. Does not bruise/bleed easily.  Psychiatric/Behavioral: Positive for depression. Negative for decreased concentration and suicidal ideas. The patient is not nervous/anxious and does not have insomnia.      Objective  Vitals:   07/21/16 0904  BP: 118/78  Pulse: 98  Resp: 16  SpO2: 98%  Weight: 212 lb (96.2 kg)  Height: 5\' 4"  (1.626 m)    Physical Exam  Constitutional: She is well-developed, well-nourished, and in no distress. She is not irritable. No distress.  HENT:  Head: Normocephalic and atraumatic.  Right Ear: External ear normal.  Left Ear: External ear normal.  Nose: Nose normal.  Mouth/Throat: Oropharynx is clear and moist.  Eyes: Conjunctivae and EOM are normal. Pupils are equal, round, and reactive to light. Right eye exhibits no discharge. Left eye exhibits no discharge.  Neck: Normal range of motion. Neck supple. No JVD present. No thyromegaly present.  Cardiovascular: Normal rate, regular rhythm, normal heart sounds and intact distal pulses.  Exam reveals no gallop and no friction rub.   No murmur heard. Pulmonary/Chest: Effort normal and breath sounds normal. She has no wheezes. She has no rales.  Abdominal: Soft. Bowel sounds are normal. She exhibits no mass. There is no tenderness. There is no guarding.  Musculoskeletal: Normal range of motion. She exhibits no edema.  Lymphadenopathy:    She has no cervical adenopathy.  Neurological: She is alert.  Skin: Skin is warm and dry. She is not diaphoretic.  Psychiatric: Mood and affect normal.  Nursing note and vitals reviewed.     Assessment & Plan  Problem List Items Addressed This Visit      Cardiovascular and Mediastinum   BP (high  blood pressure)   Relevant Medications   hydrochlorothiazide (HYDRODIURIL) 12.5 MG tablet   amLODipine-benazepril (LOTREL) 5-10 MG capsule      Meds ordered this encounter  Medications  . hydrochlorothiazide (HYDRODIURIL) 12.5 MG tablet    Sig: Take 1 tablet (12.5 mg total) by mouth daily.    Dispense:  30 tablet    Refill:  6  . amLODipine-benazepril (LOTREL) 5-10 MG capsule    Sig: Take 1 capsule by mouth daily.    Dispense:  30 capsule    Refill:  6      Dr. Elizabeth Sauer [facility]  07/21/16

## 2016-08-09 IMAGING — MG MM DIGITAL SCREENING BILAT W/ CAD
1 series · 4 of 4 positions shown · non-contrast
Comparison: Previous exam(s).

CLINICAL DATA: Screening.

EXAM:
DIGITAL SCREENING BILATERAL MAMMOGRAM WITH CAD

[R CC · right · 4 of 4 slices shown]
[im 1/4]
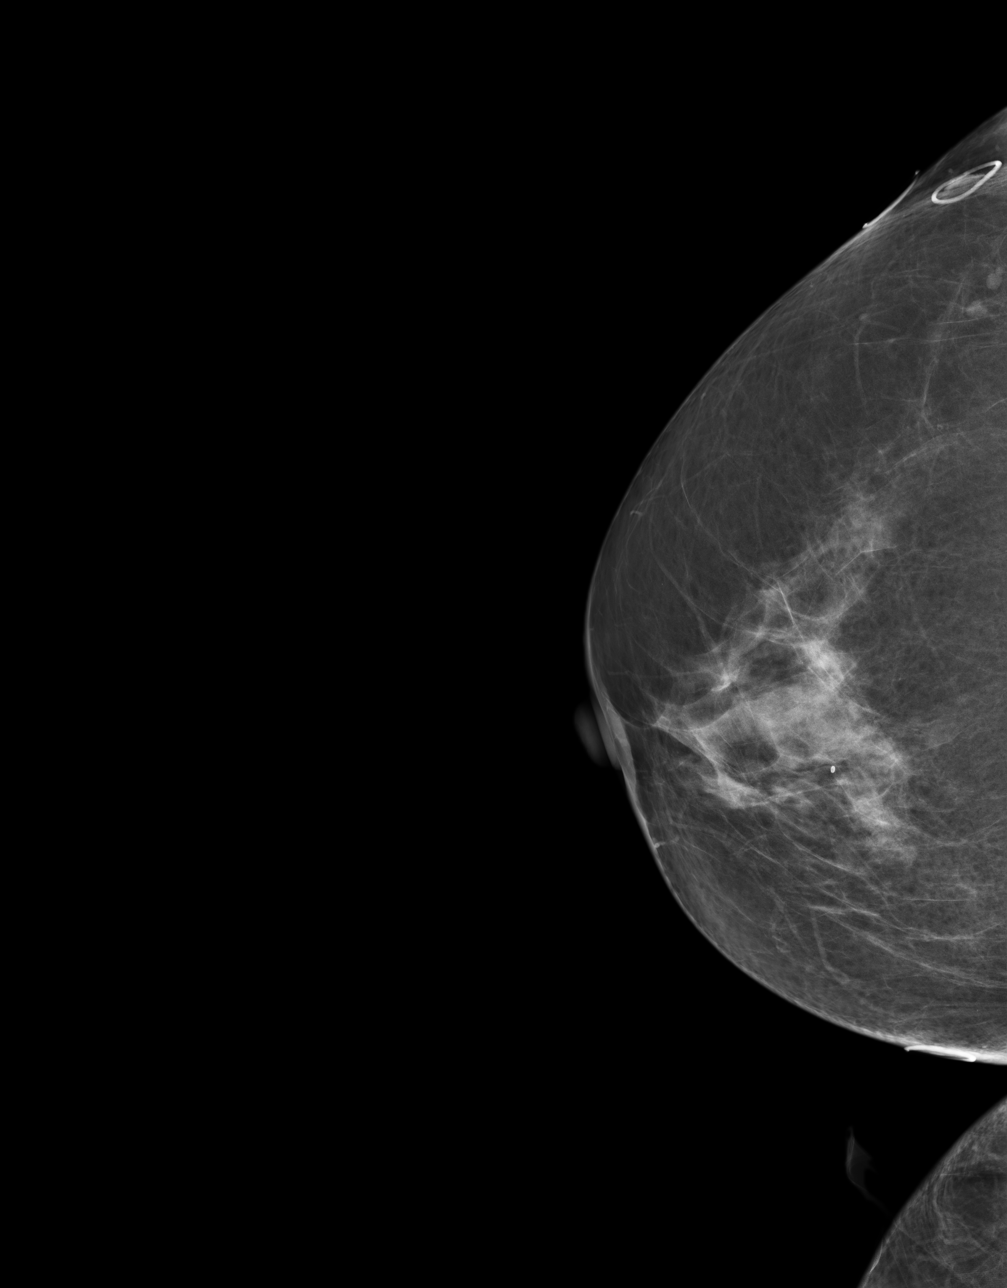
[im 2/4]
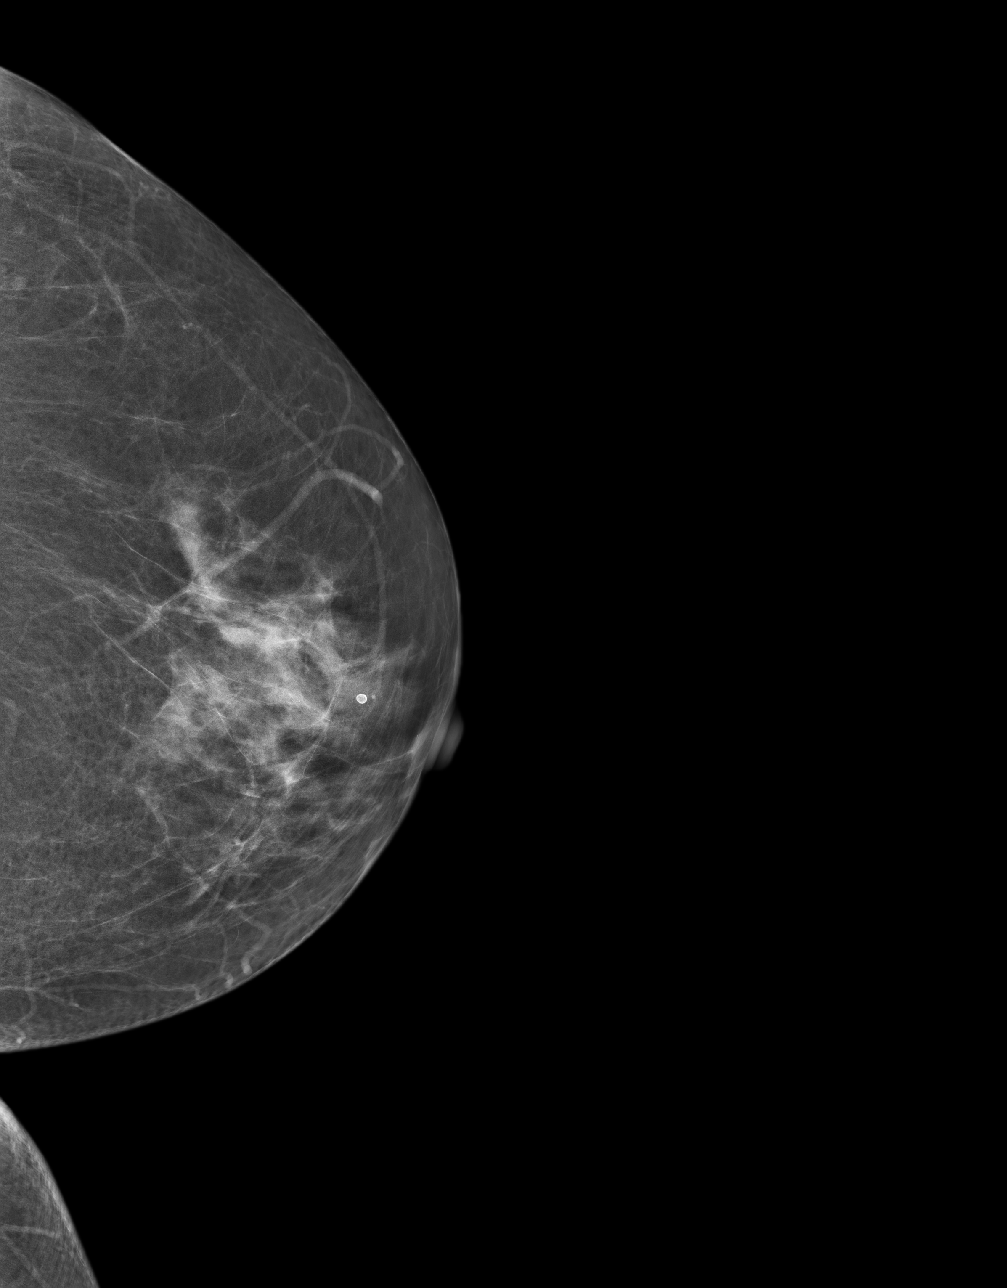
[im 3/4]
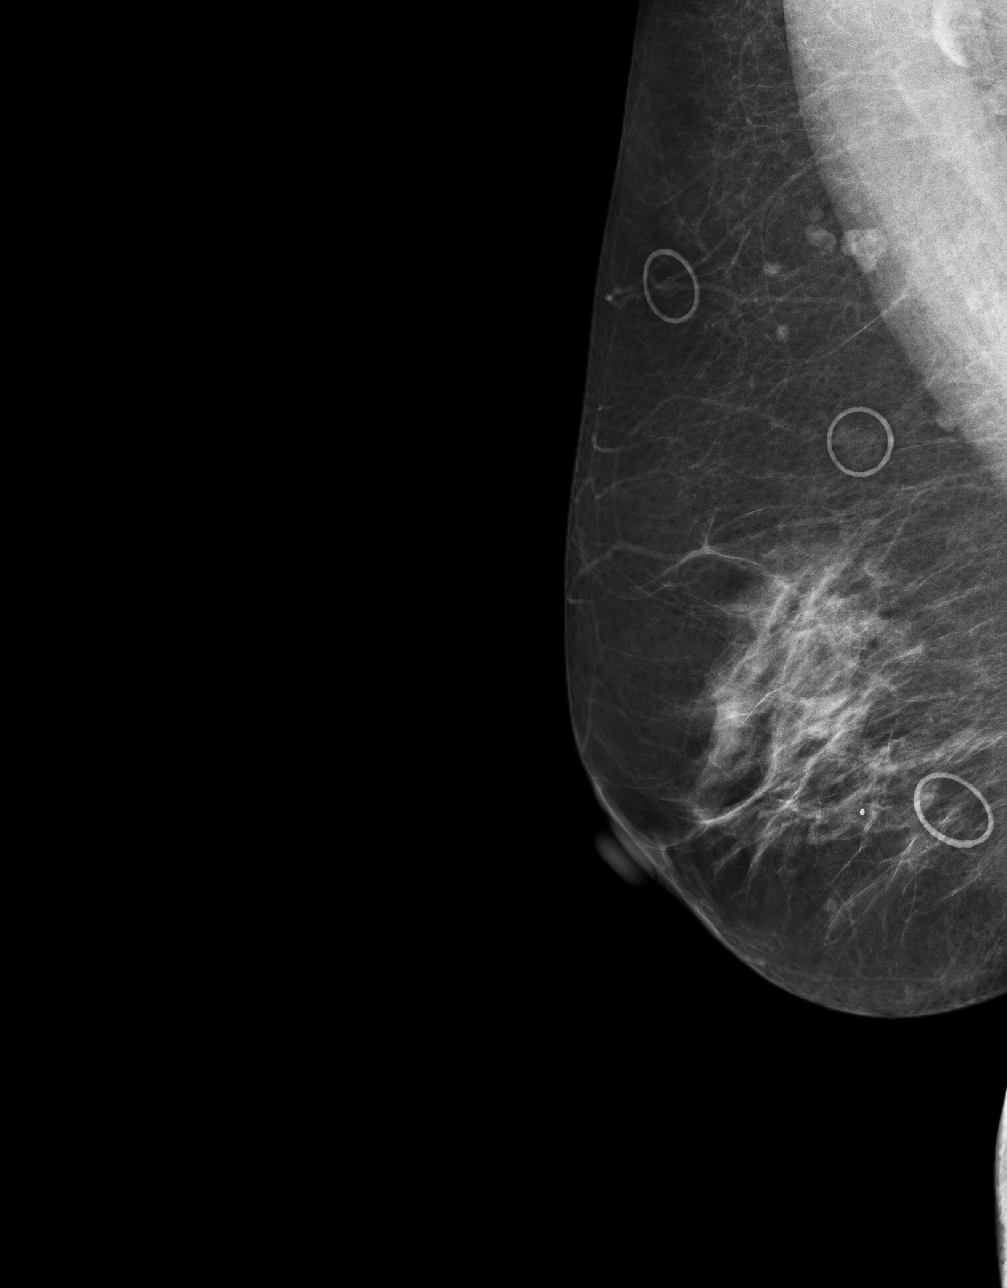
[im 4/4]
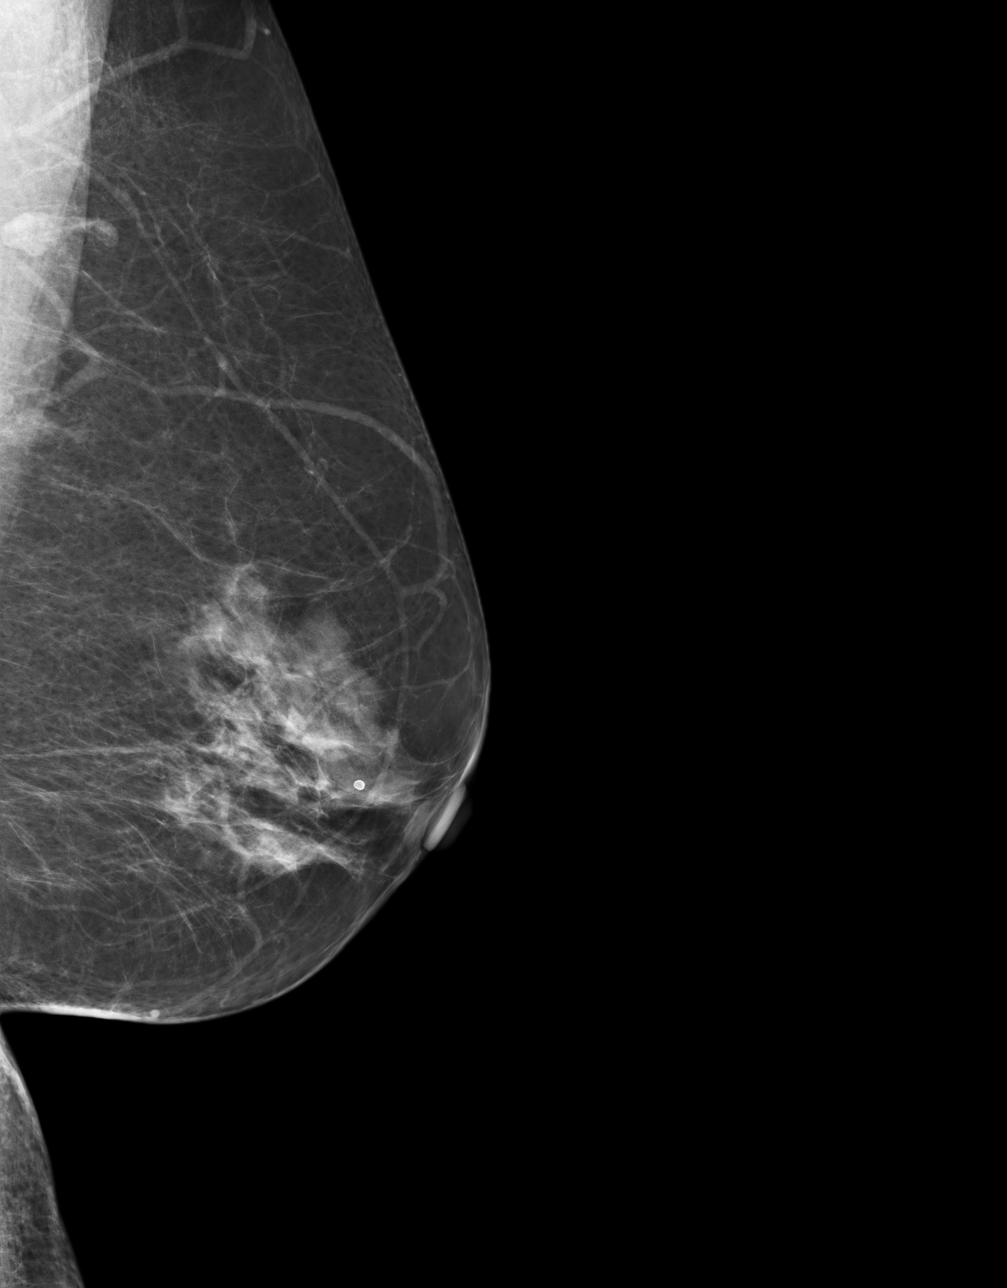

[4 of 4 positions shown; findings below may reference images not displayed]

ACR Breast Density Category b: There are scattered areas of
fibroglandular density.
FINDINGS: There are no findings suspicious for malignancy. Images were
processed with CAD.
IMPRESSION: No mammographic evidence of malignancy. A result letter of this
screening mammogram will be mailed directly to the patient.

RECOMMENDATION:
Screening mammogram in one year. (Code:AS-G-LCT)

BI-RADS CATEGORY  1: Negative.

## 2016-12-19 ENCOUNTER — Other Ambulatory Visit: Payer: Self-pay | Admitting: Obstetrics and Gynecology

## 2016-12-19 DIAGNOSIS — Z1231 Encounter for screening mammogram for malignant neoplasm of breast: Secondary | ICD-10-CM

## 2016-12-22 ENCOUNTER — Ambulatory Visit
Admission: RE | Admit: 2016-12-22 | Discharge: 2016-12-22 | Disposition: A | Discharge status: Home / Self Care | Payer: BC Managed Care – PPO | Source: Ambulatory Visit | Attending: Obstetrics and Gynecology | Admitting: Obstetrics and Gynecology

## 2016-12-22 DIAGNOSIS — Z1231 Encounter for screening mammogram for malignant neoplasm of breast: Secondary | ICD-10-CM | POA: Diagnosis not present

## 2017-01-07 ENCOUNTER — Encounter: Payer: Self-pay | Admitting: Family Medicine

## 2017-01-07 ENCOUNTER — Ambulatory Visit (INDEPENDENT_AMBULATORY_CARE_PROVIDER_SITE_OTHER): Payer: BC Managed Care – PPO | Admitting: Family Medicine

## 2017-01-07 VITALS — BP 120/80 | HR 64 | Temp 98.6°F | Ht 64.0 in | Wt 208.0 lb

## 2017-01-07 DIAGNOSIS — J0100 Acute maxillary sinusitis, unspecified: Secondary | ICD-10-CM | POA: Diagnosis not present

## 2017-01-07 MED ORDER — AMOXICILLIN 500 MG PO CAPS: 500.0000 mg | ORAL_CAPSULE | Freq: Three times a day (TID) | ORAL | 0 refills | Status: DC

## 2017-01-07 NOTE — Progress Notes (Signed)
Name: Vanessa Alvarado   MRN: [name]    DOB: 1950-11-27   Date:01/07/2017       Progress Note  Subjective  Chief Complaint  Chief Complaint  Patient presents with  . Sinusitis    cough and cong, hoarse, sore throat from drainage, hurts to swallow    Sinusitis  This is a new problem. The current episode started in the past 7 days. The problem has been gradually worsening since onset. There has been no fever. The pain is mild. Associated symptoms include chills, congestion, coughing, ear pain, headaches, sinus pressure, sneezing, a sore throat and swollen glands. Pertinent negatives include no diaphoresis, hoarse voice, neck pain or shortness of breath. Past treatments include oral decongestants. The treatment provided mild relief.    No problem-specific Assessment & Plan notes found for this encounter.   Past Medical History:  Diagnosis Date  . Depression   . Hypertension     Past Surgical History:  Procedure Laterality Date  . herniated disc     lower back  . OVARIAN CYST REMOVAL      Family History  Problem Relation Age of Onset  . Heart disease Father   . Breast cancer Neg Hx     Social History   Social History  . Marital status: Married    Spouse name: N/A  . Number of children: N/A  . Years of education: N/A   Occupational History  . Not on file.   Social History Main Topics  . Smoking status: Former Games developermoker  . Smokeless tobacco: Never Used  . Alcohol use 0.0 oz/week  . Drug use: No  . Sexual activity: Yes   Other Topics Concern  . Not on file   Social History Narrative  . No narrative on file    No Known Allergies  Outpatient Medications Prior to Visit  Medication Sig Dispense Refill  . amLODipine-benazepril (LOTREL) 5-10 MG capsule Take 1 capsule by mouth daily. 30 capsule 6  . cetirizine (ZYRTEC) 10 MG tablet Take 10 mg by mouth daily.    . hydrochlorothiazide (HYDRODIURIL) 12.5 MG tablet Take 1 tablet (12.5 mg total) by mouth daily. 30  tablet 6   No facility-administered medications prior to visit.     Review of Systems  Constitutional: Positive for chills. Negative for diaphoresis, fever, malaise/fatigue and weight loss.  HENT: Positive for congestion, ear pain, sinus pressure, sneezing and sore throat. Negative for ear discharge and hoarse voice.   Eyes: Negative for blurred vision.  Respiratory: Positive for cough. Negative for sputum production, shortness of breath and wheezing.   Cardiovascular: Negative for chest pain, palpitations and leg swelling.  Gastrointestinal: Negative for abdominal pain, blood in stool, constipation, diarrhea, heartburn, melena and nausea.  Genitourinary: Negative for dysuria, frequency, hematuria and urgency.  Musculoskeletal: Negative for back pain, joint pain, myalgias and neck pain.  Skin: Negative for rash.  Neurological: Positive for headaches. Negative for dizziness, tingling, sensory change and focal weakness.  Endo/Heme/Allergies: Negative for environmental allergies and polydipsia. Does not bruise/bleed easily.  Psychiatric/Behavioral: Negative for depression and suicidal ideas. The patient is not nervous/anxious and does not have insomnia.      Objective  Vitals:   01/07/17 1145  BP: 120/80  Pulse: 64  Temp: 98.6 F (37 C)  TempSrc: Oral  Weight: 208 lb (94.3 kg)  Height: 5\' 4"  (1.626 m)    Physical Exam  Constitutional: She is well-developed, well-nourished, and in no distress. No distress.  HENT:  Head: Normocephalic  and atraumatic.  Right Ear: Tympanic membrane and external ear normal. No middle ear effusion.  Left Ear: External ear normal. A middle ear effusion is present.  Nose: Right sinus exhibits maxillary sinus tenderness. Left sinus exhibits maxillary sinus tenderness.  Mouth/Throat: Posterior oropharyngeal erythema present. No oropharyngeal exudate or posterior oropharyngeal edema.  Eyes: Pupils are equal, round, and reactive to light. Conjunctivae and  EOM are normal. Right eye exhibits no discharge. Left eye exhibits no discharge.  Neck: Normal range of motion. Neck supple. No JVD present. No thyromegaly present.  Cardiovascular: Normal rate, regular rhythm, normal heart sounds and intact distal pulses.  Exam reveals no gallop and no friction rub.   No murmur heard. Pulmonary/Chest: Effort normal and breath sounds normal. She has no wheezes. She has no rales.  Abdominal: Soft. Bowel sounds are normal. She exhibits no mass. There is no tenderness. There is no guarding.  Musculoskeletal: Normal range of motion. She exhibits no edema.  Lymphadenopathy:       Head (right side): No submandibular adenopathy present.       Head (left side): No submandibular adenopathy present.    She has no cervical adenopathy.  Neurological: She is alert. She has normal reflexes.  Skin: Skin is warm and dry. She is not diaphoretic.  Psychiatric: Mood and affect normal.  Nursing note and vitals reviewed.     Assessment & Plan  Problem List Items Addressed This Visit    None    Visit Diagnoses    Acute maxillary sinusitis, recurrence not specified    -  Primary   Relevant Medications   amoxicillin (AMOXIL) 500 MG capsule      Meds ordered this encounter  Medications  . amoxicillin (AMOXIL) 500 MG capsule    Sig: Take 1 capsule (500 mg total) by mouth 3 (three) times daily.    Dispense:  30 capsule    Refill:  0      Dr. Elizabeth Sauer [facility]  01/07/17

## 2017-03-02 ENCOUNTER — Ambulatory Visit: Payer: BC Managed Care – PPO | Admitting: Family Medicine

## 2017-03-02 ENCOUNTER — Encounter: Payer: Self-pay | Admitting: Family Medicine

## 2017-03-02 DIAGNOSIS — I10 Essential (primary) hypertension: Secondary | ICD-10-CM | POA: Diagnosis not present

## 2017-03-02 MED ORDER — AMLODIPINE BESY-BENAZEPRIL HCL 5-10 MG PO CAPS: 1.0000 | ORAL_CAPSULE | Freq: Every day | ORAL | 2 refills | Status: DC

## 2017-03-02 MED ORDER — HYDROCHLOROTHIAZIDE 12.5 MG PO TABS: 12.5000 mg | ORAL_TABLET | Freq: Every day | ORAL | 2 refills | Status: DC

## 2017-03-02 NOTE — Progress Notes (Signed)
Name: Vanessa CongKathryn A Alvarado   MRN: [id number]    DOB: April 11, 1950   Date:03/02/2017       Progress Note  Subjective  Chief Complaint  Chief Complaint  Patient presents with  . Hypertension    Hypertension  This is a chronic problem. The current episode started more than 1 year ago. The problem is unchanged. The problem is controlled. Pertinent negatives include no anxiety, blurred vision, chest pain, headaches, malaise/fatigue, neck pain, orthopnea, palpitations, peripheral edema, PND, shortness of breath or sweats. There are no associated agents to hypertension. There are no known risk factors for coronary artery disease. Past treatments include ACE inhibitors, diuretics and calcium channel blockers. The current treatment provides mild improvement. There are no compliance problems.  There is no history of angina, kidney disease, CAD/MI, CVA, heart failure, left ventricular hypertrophy, PVD or retinopathy. There is no history of chronic renal disease, a hypertension causing med or renovascular disease.    No problem-specific Assessment & Plan notes found for this encounter.   Past Medical History:  Diagnosis Date  . Depression   . Hypertension     Past Surgical History:  Procedure Laterality Date  . herniated disc     lower back  . OVARIAN CYST REMOVAL      Family History  Problem Relation Age of Onset  . Heart disease Father   . Breast cancer Neg Hx     Social History   Socioeconomic History  . Marital status: Married    Spouse name: Not on file  . Number of children: Not on file  . Years of education: Not on file  . Highest education level: Not on file  Social Needs  . Financial resource strain: Not on file  . Food insecurity - worry: Not on file  . Food insecurity - inability: Not on file  . Transportation needs - medical: Not on file  . Transportation needs - non-medical: Not on file  Occupational History  . Not on file  Tobacco Use  . Smoking status: Former Games developermoker   . Smokeless tobacco: Never Used  Substance and Sexual Activity  . Alcohol use: Yes    Alcohol/week: 0.0 oz  . Drug use: No  . Sexual activity: Yes  Other Topics Concern  . Not on file  Social History Narrative  . Not on file    No Known Allergies  Outpatient Medications Prior to Visit  Medication Sig Dispense Refill  . cetirizine (ZYRTEC) 10 MG tablet Take 10 mg by mouth daily.    Marland Kitchen. amLODipine-benazepril (LOTREL) 5-10 MG capsule Take 1 capsule by mouth daily. 30 capsule 6  . hydrochlorothiazide (HYDRODIURIL) 12.5 MG tablet Take 1 tablet (12.5 mg total) by mouth daily. 30 tablet 6  . amoxicillin (AMOXIL) 500 MG capsule Take 1 capsule (500 mg total) by mouth 3 (three) times daily. (Patient not taking: Reported on 03/02/2017) 30 capsule 0   No facility-administered medications prior to visit.     Review of Systems  Constitutional: Negative for chills, fever, malaise/fatigue and weight loss.  HENT: Negative for ear discharge, ear pain and sore throat.   Eyes: Negative for blurred vision.  Respiratory: Negative for cough, sputum production, shortness of breath and wheezing.   Cardiovascular: Negative for chest pain, palpitations, orthopnea, leg swelling and PND.  Gastrointestinal: Negative for abdominal pain, blood in stool, constipation, diarrhea, heartburn, melena and nausea.  Genitourinary: Negative for dysuria, frequency, hematuria and urgency.  Musculoskeletal: Negative for back pain, joint pain, myalgias and neck  pain.  Skin: Negative for rash.  Neurological: Negative for dizziness, tingling, sensory change, focal weakness and headaches.  Endo/Heme/Allergies: Negative for environmental allergies and polydipsia. Does not bruise/bleed easily.  Psychiatric/Behavioral: Negative for depression and suicidal ideas. The patient is not nervous/anxious and does not have insomnia.      Objective  Vitals:   03/02/17 1021  BP: 114/70  Pulse: 68  SpO2: 95%  Weight: 211 lb (95.7  kg)  Height: 5\' 4"  (1.626 m)    Physical Exam  Constitutional: She is well-developed, well-nourished, and in no distress. No distress.  HENT:  Head: Normocephalic and atraumatic.  Right Ear: External ear normal.  Left Ear: External ear normal.  Nose: Nose normal.  Mouth/Throat: Oropharynx is clear and moist.  Eyes: Conjunctivae and EOM are normal. Pupils are equal, round, and reactive to light. Right eye exhibits no discharge. Left eye exhibits no discharge.  Neck: Normal range of motion. Neck supple. No JVD present. No thyromegaly present.  Cardiovascular: Normal rate, regular rhythm, normal heart sounds and intact distal pulses. Exam reveals no gallop and no friction rub.  No murmur heard. Pulmonary/Chest: Effort normal and breath sounds normal. She has no wheezes. She has no rales.  Abdominal: Soft. Bowel sounds are normal. She exhibits no mass. There is no tenderness. There is no guarding.  Musculoskeletal: Normal range of motion. She exhibits no edema.  Lymphadenopathy:    She has no cervical adenopathy.  Neurological: She is alert. She has normal reflexes.  Skin: Skin is warm and dry. She is not diaphoretic.  Psychiatric: Mood and affect normal.  Nursing note and vitals reviewed.     Assessment & Plan  Problem List Items Addressed This Visit      Cardiovascular and Mediastinum   BP (high blood pressure)   Relevant Medications   amLODipine-benazepril (LOTREL) 5-10 MG capsule   hydrochlorothiazide (HYDRODIURIL) 12.5 MG tablet   Other Relevant Orders   Renal function panel      Meds ordered this encounter  Medications  . amLODipine-benazepril (LOTREL) 5-10 MG capsule    Sig: Take 1 capsule by mouth daily.    Dispense:  90 capsule    Refill:  2  . hydrochlorothiazide (HYDRODIURIL) 12.5 MG tablet    Sig: Take 1 tablet (12.5 mg total) by mouth daily.    Dispense:  90 tablet    Refill:  2      Dr. Elizabeth [facility]uereanna Jones [facility]  03/02/17

## 2017-03-03 LAB — RENAL FUNCTION PANEL
ALBUMIN: 4.6 g/dL (ref 3.6–4.8)
BUN/Creatinine Ratio: 16 (ref 12–28)
BUN: 16 mg/dL (ref 8–27)
CALCIUM: 9.6 mg/dL (ref 8.7–10.3)
CHLORIDE: 100 mmol/L (ref 96–106)
CO2: 26 mmol/L (ref 20–29)
Creatinine, Ser: 0.98 mg/dL (ref 0.57–1.00)
GFR calc Af Amer: 71 mL/min/{1.73_m2} (ref >59)
GFR calc non Af Amer: 61 mL/min/{1.73_m2} (ref >59)
GLUCOSE: 88 mg/dL (ref 65–99)
PHOSPHORUS: 2.8 mg/dL (ref 2.5–4.5)
POTASSIUM: 4.3 mmol/L (ref 3.5–5.2)
SODIUM: 143 mmol/L (ref 134–144)

## 2017-12-01 ENCOUNTER — Ambulatory Visit: Payer: Medicare Other | Admitting: Family Medicine

## 2017-12-01 ENCOUNTER — Encounter: Payer: Self-pay | Admitting: Family Medicine

## 2017-12-01 VITALS — BP 132/84 | HR 85 | Ht 64.0 in | Wt 213.0 lb

## 2017-12-01 DIAGNOSIS — Z23 Encounter for immunization: Secondary | ICD-10-CM

## 2017-12-01 DIAGNOSIS — I10 Essential (primary) hypertension: Secondary | ICD-10-CM

## 2017-12-01 MED ORDER — HYDROCHLOROTHIAZIDE 12.5 MG PO TABS: 12.5000 mg | ORAL_TABLET | Freq: Every day | ORAL | 2 refills | Status: DC

## 2017-12-01 MED ORDER — AMLODIPINE BESY-BENAZEPRIL HCL 5-10 MG PO CAPS: 1.0000 | ORAL_CAPSULE | Freq: Every day | ORAL | 2 refills | Status: DC

## 2017-12-01 NOTE — Assessment & Plan Note (Signed)
Chronic Controlled Continue Amlodipine-benazepril 5-10 mg and HCTZ 12.5 mg daily. Check renal panel

## 2017-12-01 NOTE — Progress Notes (Signed)
Name: Vanessa Alvarado   MRN: [id number]    DOB: November 04, 1950   Date:12/01/2017       Progress Note  Subjective  Chief Complaint  Chief Complaint  Patient presents with  . Hypertension  . Allergic Rhinitis   . Flu Vaccine  . pneum vacc    prevnar 13    Hypertension  This is a chronic problem. The current episode started more than 1 year ago. The problem is unchanged. The problem is controlled. Pertinent negatives include no anxiety, blurred vision, chest pain, headaches, malaise/fatigue, neck pain, orthopnea, palpitations, peripheral edema, PND, shortness of breath or sweats. There are no associated agents to hypertension. There are no known risk factors for coronary artery disease. Past treatments include ACE inhibitors, diuretics and calcium channel blockers. The current treatment provides moderate improvement. There are no compliance problems.  There is no history of angina, kidney disease, CAD/MI, CVA, heart failure, left ventricular hypertrophy, PVD or retinopathy. There is no history of chronic renal disease, a hypertension causing med or renovascular disease.    Essential (primary) hypertension Chronic Controlled Continue Amlodipine-benazepril 5-10 mg and HCTZ 12.5 mg daily. Check renal panel   Past Medical History:  Diagnosis Date  . Depression   . Hypertension     Past Surgical History:  Procedure Laterality Date  . herniated disc     lower back  . OVARIAN CYST REMOVAL      Family History  Problem Relation Age of Onset  . Heart disease Father   . Breast cancer Neg Hx     Social History   Socioeconomic History  . Marital status: Married    Spouse name: Not on file  . Number of children: Not on file  . Years of education: Not on file  . Highest education level: Not on file  Occupational History  . Not on file  Social Needs  . Financial resource strain: Not on file  . Food insecurity:    Worry: Not on file    Inability: Not on file  . Transportation needs:     Medical: Not on file    Non-medical: Not on file  Tobacco Use  . Smoking status: Former Games developermoker  . Smokeless tobacco: Never Used  Substance and Sexual Activity  . Alcohol use: Yes    Alcohol/week: 0.0 standard drinks  . Drug use: No  . Sexual activity: Yes  Lifestyle  . Physical activity:    Days per week: Not on file    Minutes per session: Not on file  . Stress: Not on file  Relationships  . Social connections:    Talks on phone: Not on file    Gets together: Not on file    Attends religious service: Not on file    Active member of club or organization: Not on file    Attends meetings of clubs or organizations: Not on file    Relationship status: Not on file  . Intimate partner violence:    Fear of current or ex partner: Not on file    Emotionally abused: Not on file    Physically abused: Not on file    Forced sexual activity: Not on file  Other Topics Concern  . Not on file  Social History Narrative  . Not on file    No Known Allergies  Outpatient Medications Prior to Visit  Medication Sig Dispense Refill  . cetirizine (ZYRTEC) 10 MG tablet Take 10 mg by mouth daily.    Marland Kitchen. amLODipine-benazepril (LOTREL)  5-10 MG capsule Take 1 capsule by mouth daily. 90 capsule 2  . hydrochlorothiazide (HYDRODIURIL) 12.5 MG tablet Take 1 tablet (12.5 mg total) by mouth daily. 90 tablet 2   No facility-administered medications prior to visit.     Review of Systems  Constitutional: Negative for chills, fever, malaise/fatigue and weight loss.  HENT: Negative for ear discharge, ear pain and sore throat.   Eyes: Negative for blurred vision.  Respiratory: Negative for cough, sputum production, shortness of breath and wheezing.   Cardiovascular: Negative for chest pain, palpitations, orthopnea, leg swelling and PND.  Gastrointestinal: Negative for abdominal pain, blood in stool, constipation, diarrhea, heartburn, melena and nausea.  Genitourinary: Negative for dysuria, frequency,  hematuria and urgency.  Musculoskeletal: Negative for back pain, joint pain, myalgias and neck pain.  Skin: Negative for rash.  Neurological: Negative for dizziness, tingling, sensory change, focal weakness and headaches.  Endo/Heme/Allergies: Negative for environmental allergies and polydipsia. Does not bruise/bleed easily.  Psychiatric/Behavioral: Negative for depression and suicidal ideas. The patient is not nervous/anxious and does not have insomnia.      Objective  Vitals:   12/01/17 1124  BP: 132/84  Pulse: 85  SpO2: 97%  Weight: 213 lb (96.6 kg)  Height: 5\' 4"  (1.626 m)    Physical Exam  Constitutional: She is oriented to person, place, and time. She appears well-developed and well-nourished.  HENT:  Head: Normocephalic.  Right Ear: External ear normal.  Left Ear: External ear normal.  Mouth/Throat: Oropharynx is clear and moist.  Eyes: Pupils are equal, round, and reactive to light. Conjunctivae and EOM are normal. Lids are everted and swept, no foreign bodies found. Left eye exhibits no hordeolum. No foreign body present in the left eye. Right conjunctiva is not injected. Left conjunctiva is not injected. No scleral icterus.  Neck: Normal range of motion. Neck supple. No JVD present. No tracheal deviation present. No thyromegaly present.  Cardiovascular: Normal rate, regular rhythm, normal heart sounds and intact distal pulses. Exam reveals no gallop and no friction rub.  No murmur heard. Pulmonary/Chest: Effort normal and breath sounds normal. No respiratory distress. She has no wheezes. She has no rales.  Abdominal: Soft. Bowel sounds are normal. She exhibits no mass. There is no hepatosplenomegaly. There is no tenderness. There is no rebound and no guarding.  Musculoskeletal: Normal range of motion. She exhibits no edema or tenderness.  Lymphadenopathy:    She has no cervical adenopathy.  Neurological: She is alert and oriented to person, place, and time. She has  normal strength. She displays normal reflexes. No cranial nerve deficit.  Skin: Skin is warm. No rash noted.  Psychiatric: She has a normal mood and affect. Her mood appears not anxious. She does not exhibit a depressed mood.  Nursing note and vitals reviewed.     Assessment & Plan  Problem List Items Addressed This Visit      Cardiovascular and Mediastinum   Essential (primary) hypertension - Primary    Chronic Controlled Continue Amlodipine-benazepril 5-10 mg and HCTZ 12.5 mg daily. Check renal panel      Relevant Medications   amLODipine-benazepril (LOTREL) 5-10 MG capsule   hydrochlorothiazide (HYDRODIURIL) 12.5 MG tablet   Other Relevant Orders   Renal Function Panel    Other Visit Diagnoses    Flu vaccine need       Discussed and administered   Relevant Orders   Flu vaccine HIGH DOSE PF (Fluzone High dose) (Completed)   Need for pneumococcal vaccination  Discussed and administered.   Relevant Orders   Pneumococcal conjugate vaccine 13-valent (Completed)      Meds ordered this encounter  Medications  . amLODipine-benazepril (LOTREL) 5-10 MG capsule    Sig: Take 1 capsule by mouth daily.    Dispense:  90 capsule    Refill:  2  . hydrochlorothiazide (HYDRODIURIL) 12.5 MG tablet    Sig: Take 1 tablet (12.5 mg total) by mouth daily.    Dispense:  90 tablet    Refill:  2      Dr. Elizabeth Sauer [facility]  12/01/17

## 2017-12-02 LAB — RENAL FUNCTION PANEL
Albumin: 4.9 g/dL — ABNORMAL HIGH (ref 3.6–4.8)
BUN / CREAT RATIO: 17 (ref 12–28)
BUN: 17 mg/dL (ref 8–27)
CALCIUM: 10.4 mg/dL — AB (ref 8.7–10.3)
CO2: 25 mmol/L (ref 20–29)
CREATININE: 0.99 mg/dL (ref 0.57–1.00)
Chloride: 96 mmol/L (ref 96–106)
GFR, EST AFRICAN AMERICAN: 69 mL/min/{1.73_m2} (ref >59)
GFR, EST NON AFRICAN AMERICAN: 60 mL/min/{1.73_m2} (ref >59)
Glucose: 105 mg/dL — ABNORMAL HIGH (ref 65–99)
Phosphorus: 3.3 mg/dL (ref 2.5–4.5)
Potassium: 4.4 mmol/L (ref 3.5–5.2)
Sodium: 140 mmol/L (ref 134–144)

## 2018-08-29 ENCOUNTER — Other Ambulatory Visit: Payer: Self-pay | Admitting: Family Medicine

## 2018-08-29 DIAGNOSIS — I10 Essential (primary) hypertension: Secondary | ICD-10-CM

## 2018-09-03 ENCOUNTER — Encounter: Payer: Self-pay | Admitting: Family Medicine

## 2018-09-03 ENCOUNTER — Other Ambulatory Visit: Payer: Self-pay

## 2018-09-03 ENCOUNTER — Ambulatory Visit (INDEPENDENT_AMBULATORY_CARE_PROVIDER_SITE_OTHER): Payer: Medicare Other | Admitting: Family Medicine

## 2018-09-03 VITALS — BP 120/76 | HR 72 | Ht 64.0 in | Wt 208.0 lb

## 2018-09-03 DIAGNOSIS — I10 Essential (primary) hypertension: Secondary | ICD-10-CM

## 2018-09-03 DIAGNOSIS — E782 Mixed hyperlipidemia: Secondary | ICD-10-CM | POA: Diagnosis not present

## 2018-09-03 MED ORDER — HYDROCHLOROTHIAZIDE 12.5 MG PO TABS: 12.5000 mg | ORAL_TABLET | Freq: Every day | ORAL | 1 refills | Status: DC

## 2018-09-03 MED ORDER — AMLODIPINE BESY-BENAZEPRIL HCL 5-10 MG PO CAPS: 1.0000 | ORAL_CAPSULE | Freq: Every day | ORAL | 1 refills | Status: DC

## 2018-09-03 NOTE — Patient Instructions (Signed)

## 2018-09-03 NOTE — Progress Notes (Signed)
Date:  09/03/2018   Name:  Vanessa CongKathryn A Myung   DOB:  29-Mar-1950   MRN:  [id number]   Chief Complaint: Hypertension  Hypertension This is a chronic problem. The current episode started more than 1 year ago. The problem is unchanged. The problem is controlled. Pertinent negatives include no anxiety, blurred vision, chest pain, headaches, malaise/fatigue, neck pain, orthopnea, palpitations, peripheral edema, PND, shortness of breath or sweats. There are no associated agents to hypertension. Risk factors for coronary artery disease include dyslipidemia and obesity. Past treatments include ACE inhibitors, diuretics and calcium channel blockers. The current treatment provides moderate improvement. There are no compliance problems.  There is no history of angina, kidney disease, CAD/MI, CVA, heart failure, left ventricular hypertrophy, PVD or retinopathy. There is no history of chronic renal disease, a hypertension causing med or renovascular disease.  Hyperlipidemia This is a chronic problem. The current episode started more than 1 year ago. The problem is controlled. Recent lipid tests were reviewed and are normal. She has no history of chronic renal disease, diabetes, hypothyroidism, liver disease, obesity or nephrotic syndrome. Pertinent negatives include no chest pain, myalgias or shortness of breath.    Review of Systems  Constitutional: Negative for chills, fever and malaise/fatigue.  HENT: Negative for drooling, ear discharge, ear pain and sore throat.   Eyes: Negative for blurred vision.  Respiratory: Negative for cough, chest tightness, shortness of breath and wheezing.   Cardiovascular: Negative for chest pain, palpitations, orthopnea, leg swelling and PND.  Gastrointestinal: Negative for abdominal pain, blood in stool, constipation, diarrhea and nausea.  Endocrine: Negative for polydipsia.  Genitourinary: Negative for dysuria, frequency, hematuria and urgency.  Musculoskeletal: Negative  for back pain, myalgias and neck pain.  Skin: Negative for rash.  Allergic/Immunologic: Negative for environmental allergies.  Neurological: Negative for dizziness and headaches.  Hematological: Does not bruise/bleed easily.  Psychiatric/Behavioral: Negative for suicidal ideas. The patient is not nervous/anxious.     Patient Active Problem List   Diagnosis Date Noted  . Psoriasis 08/28/2014  . Allergic rhinitis, seasonal 08/28/2014  . Clinical depression 08/25/2014  . BP (high blood pressure) 08/25/2014  . Essential (primary) hypertension 09/05/2013  . Pleurisy 09/05/2013    No Known Allergies  Past Surgical History:  Procedure Laterality Date  . herniated disc     lower back  . OVARIAN CYST REMOVAL      Social History   Tobacco Use  . Smoking status: Former Games developermoker  . Smokeless tobacco: Never Used  Substance Use Topics  . Alcohol use: Yes    Alcohol/week: 0.0 standard drinks  . Drug use: No     Medication list has been reviewed and updated.  Current Meds  Medication Sig  . amLODipine-benazepril (LOTREL) 5-10 MG capsule Take 1 capsule by mouth once daily  . cetirizine (ZYRTEC) 10 MG tablet Take 10 mg by mouth daily.  . hydrochlorothiazide (HYDRODIURIL) 12.5 MG tablet Take 1 tablet by mouth once daily    PHQ 2/9 Scores 09/03/2018 12/01/2017 03/02/2017 07/21/2016  PHQ - 2 Score 0 0 0 0  PHQ- 9 Score 0 - 0 0    BP Readings from Last 3 Encounters:  09/03/18 120/76  12/01/17 132/84  03/02/17 114/70    Physical Exam Vitals signs and nursing note reviewed.  Constitutional:      General: She is not in acute distress.    Appearance: She is well-developed. She is not diaphoretic.  HENT:     Head: Normocephalic and atraumatic.  Right Ear: Tympanic membrane, ear canal and external ear normal.     Left Ear: Tympanic membrane, ear canal and external ear normal.     Nose: Nose normal. No congestion or rhinorrhea.     Mouth/Throat:     Mouth: Mucous membranes are  moist.  Eyes:     General: Lids are everted, no foreign bodies appreciated. No scleral icterus.       Right eye: No discharge.        Left eye: No foreign body, discharge or hordeolum.     Conjunctiva/sclera: Conjunctivae normal.     Right eye: Right conjunctiva is not injected.     Left eye: Left conjunctiva is not injected.     Pupils: Pupils are equal, round, and reactive to light.  Neck:     Musculoskeletal: Normal range of motion and neck supple.     Thyroid: No thyromegaly.     Vascular: No JVD.     Trachea: No tracheal deviation.  Cardiovascular:     Rate and Rhythm: Normal rate and regular rhythm.     Pulses: Normal pulses.     Heart sounds: Normal heart sounds. No murmur. No friction rub. No gallop.   Pulmonary:     Effort: Pulmonary effort is normal. No respiratory distress.     Breath sounds: Normal breath sounds. No stridor. No wheezing, rhonchi or rales.  Chest:     Chest wall: No tenderness.  Abdominal:     General: Bowel sounds are normal.     Palpations: Abdomen is soft. There is no mass.     Tenderness: There is no abdominal tenderness. There is no guarding or rebound.  Musculoskeletal: Normal range of motion.        General: No tenderness.     Right lower leg: No edema.     Left lower leg: No edema.  Lymphadenopathy:     Cervical: No cervical adenopathy.  Skin:    General: Skin is warm and dry.     Capillary Refill: Capillary refill takes less than 2 seconds.     Coloration: Skin is not jaundiced or pale.     Findings: No bruising, erythema, lesion or rash.  Neurological:     General: No focal deficit present.     Mental Status: She is alert and oriented to person, place, and time.     Cranial Nerves: No cranial nerve deficit.     Deep Tendon Reflexes: Reflexes are normal and symmetric. Reflexes normal.  Psychiatric:        Mood and Affect: Mood is not anxious or depressed.     Wt Readings from Last 3 Encounters:  09/03/18 208 lb (94.3 kg)  12/01/17  213 lb (96.6 kg)  03/02/17 211 lb (95.7 kg)    BP 120/76   Pulse 72   Ht 5\' 4"  (1.626 m)   Wt 208 lb (94.3 kg)   BMI 35.70 kg/m   Assessment and Plan:  1. Essential (primary) hypertension Chronic.  Controlled.  Continue hydrochlorothiazide 12.5 mg once a day and Lotrel 5-10 mg once a day will check renal function panel.  Will recheck patient in 6 months. - hydrochlorothiazide (HYDRODIURIL) 12.5 MG tablet; Take 1 tablet (12.5 mg total) by mouth daily.  Dispense: 90 tablet; Refill: 1 - amLODipine-benazepril (LOTREL) 5-10 MG capsule; Take 1 capsule by mouth daily.  Dispense: 90 capsule; Refill: 1 - Renal Function Panel  2. Moderate mixed hyperlipidemia not requiring statin therapy Patient has elevated lipids in  the past although she is not currently on statin therapy patient had a moderate lunch today.  Check a lipid panel and adjust accordingly in the meantime patient was given a low-cholesterol diet. - Lipid panel

## 2018-09-04 LAB — RENAL FUNCTION PANEL
Albumin: 4.7 g/dL (ref 3.8–4.8)
BUN/Creatinine Ratio: 21 (ref 12–28)
BUN: 22 mg/dL (ref 8–27)
CO2: 26 mmol/L (ref 20–29)
Calcium: 10.1 mg/dL (ref 8.7–10.3)
Chloride: 97 mmol/L (ref 96–106)
Creatinine, Ser: 1.06 mg/dL — ABNORMAL HIGH (ref 0.57–1.00)
GFR calc Af Amer: 64 mL/min/{1.73_m2} (ref >59)
GFR calc non Af Amer: 55 mL/min/{1.73_m2} — ABNORMAL LOW (ref >59)
Glucose: 106 mg/dL — ABNORMAL HIGH (ref 65–99)
Phosphorus: 3 mg/dL (ref 3.0–4.3)
Potassium: 4.1 mmol/L (ref 3.5–5.2)
Sodium: 139 mmol/L (ref 134–144)

## 2018-09-04 LAB — LIPID PANEL
Chol/HDL Ratio: 3.9 ratio (ref 0.0–4.4)
Cholesterol, Total: 197 mg/dL (ref 100–199)
HDL: 50 mg/dL (ref >39)
LDL Calculated: 100 mg/dL — ABNORMAL HIGH (ref 0–99)
Triglycerides: 236 mg/dL — ABNORMAL HIGH (ref 0–149)
VLDL Cholesterol Cal: 47 mg/dL — ABNORMAL HIGH (ref 5–40)

## 2019-01-13 ENCOUNTER — Other Ambulatory Visit: Payer: Self-pay | Admitting: Obstetrics and Gynecology

## 2019-01-13 DIAGNOSIS — Z1231 Encounter for screening mammogram for malignant neoplasm of breast: Secondary | ICD-10-CM

## 2019-04-10 ENCOUNTER — Other Ambulatory Visit: Payer: Self-pay | Admitting: Family Medicine

## 2019-04-10 DIAGNOSIS — I10 Essential (primary) hypertension: Secondary | ICD-10-CM

## 2019-04-14 ENCOUNTER — Encounter: Payer: Self-pay | Admitting: Family Medicine

## 2019-04-14 ENCOUNTER — Ambulatory Visit (INDEPENDENT_AMBULATORY_CARE_PROVIDER_SITE_OTHER): Payer: Medicare PPO | Admitting: Family Medicine

## 2019-04-14 ENCOUNTER — Other Ambulatory Visit: Payer: Self-pay

## 2019-04-14 VITALS — BP 120/80 | HR 62 | Ht 64.0 in | Wt 207.0 lb

## 2019-04-14 DIAGNOSIS — I10 Essential (primary) hypertension: Secondary | ICD-10-CM

## 2019-04-14 DIAGNOSIS — E782 Mixed hyperlipidemia: Secondary | ICD-10-CM

## 2019-04-14 DIAGNOSIS — Z23 Encounter for immunization: Secondary | ICD-10-CM | POA: Diagnosis not present

## 2019-04-14 MED ORDER — AMLODIPINE BESY-BENAZEPRIL HCL 5-10 MG PO CAPS: 1.0000 | ORAL_CAPSULE | Freq: Every day | ORAL | 1 refills | Status: DC

## 2019-04-14 MED ORDER — HYDROCHLOROTHIAZIDE 12.5 MG PO TABS: 12.5000 mg | ORAL_TABLET | Freq: Every day | ORAL | 1 refills | Status: DC

## 2019-04-14 NOTE — Progress Notes (Signed)
Date:  04/14/2019   Name:  Vanessa Alvarado   DOB:  1950-08-01   MRN:  [id number]   Chief Complaint: Hypertension, Hyperlipidemia (not on any meds right now), and pneum 23  Hypertension This is a chronic problem. The current episode started more than 1 year ago. The problem has been gradually improving since onset. The problem is controlled. Pertinent negatives include no anxiety, blurred vision, chest pain, headaches, malaise/fatigue, neck pain, orthopnea, palpitations, peripheral edema, PND, shortness of breath or sweats. There are no associated agents to hypertension. There are no known risk factors for coronary artery disease. Past treatments include ACE inhibitors, calcium channel blockers and diuretics. The current treatment provides moderate improvement. There are no compliance problems.  There is no history of angina, kidney disease, CAD/MI, CVA, heart failure, left ventricular hypertrophy, PVD or retinopathy. There is no history of chronic renal disease, a hypertension causing med or renovascular disease.  Hyperlipidemia This is a chronic problem. The current episode started more than 1 year ago. The problem is controlled. Recent lipid tests were reviewed and are normal. She has no history of chronic renal disease, diabetes, hypothyroidism, liver disease, obesity or nephrotic syndrome. Pertinent negatives include no chest pain, focal sensory loss, focal weakness, leg pain, myalgias or shortness of breath. Current antihyperlipidemic treatment includes diet change. The current treatment provides moderate improvement of lipids. There are no compliance problems.     Lab Results  Component Value Date   CREATININE 1.06 (H) 09/03/2018   BUN 22 09/03/2018   NA 139 09/03/2018   K 4.1 09/03/2018   CL 97 09/03/2018   CO2 26 09/03/2018   Lab Results  Component Value Date   CHOL 197 09/03/2018   HDL 50 09/03/2018   LDLCALC 100 (H) 09/03/2018   TRIG 236 (H) 09/03/2018   CHOLHDL 3.9  09/03/2018   No results found for: TSH No results found for: HGBA1C   Review of Systems  Constitutional: Negative.  Negative for chills, fatigue, fever, malaise/fatigue and unexpected weight change.  HENT: Negative for congestion, ear discharge, ear pain, rhinorrhea, sinus pressure, sneezing and sore throat.   Eyes: Negative for blurred vision, photophobia, pain, discharge, redness and itching.  Respiratory: Negative for cough, shortness of breath, wheezing and stridor.   Cardiovascular: Negative for chest pain, palpitations, orthopnea and PND.  Gastrointestinal: Negative for abdominal pain, blood in stool, constipation, diarrhea, nausea and vomiting.  Endocrine: Negative for cold intolerance, heat intolerance, polydipsia, polyphagia and polyuria.  Genitourinary: Negative for dysuria, flank pain, frequency, hematuria, menstrual problem, pelvic pain, urgency, vaginal bleeding and vaginal discharge.  Musculoskeletal: Negative for arthralgias, back pain, myalgias and neck pain.  Skin: Negative for rash.  Allergic/Immunologic: Negative for environmental allergies and food allergies.  Neurological: Negative for dizziness, focal weakness, weakness, light-headedness, numbness and headaches.  Hematological: Negative for adenopathy. Does not bruise/bleed easily.  Psychiatric/Behavioral: Negative for dysphoric mood. The patient is not nervous/anxious.     Patient Active Problem List   Diagnosis Date Noted  . Psoriasis 08/28/2014  . Allergic rhinitis, seasonal 08/28/2014  . Clinical depression 08/25/2014  . BP (high blood pressure) 08/25/2014  . Essential (primary) hypertension 09/05/2013  . Pleurisy 09/05/2013    No Known Allergies  Past Surgical History:  Procedure Laterality Date  . herniated disc     lower back  . OVARIAN CYST REMOVAL      Social History   Tobacco Use  . Smoking status: Former Research scientist (life sciences)  . Smokeless tobacco: Never Used  Substance Use Topics  . Alcohol use: Yes      Alcohol/week: 0.0 standard drinks  . Drug use: No     Medication list has been reviewed and updated.  Current Meds  Medication Sig  . amLODipine-benazepril (LOTREL) 5-10 MG capsule Take 1 capsule by mouth once daily  . cetirizine (ZYRTEC) 10 MG tablet Take 10 mg by mouth daily.  . hydrochlorothiazide (HYDRODIURIL) 12.5 MG tablet Take 1 tablet by mouth once daily    PHQ 2/9 Scores 04/14/2019 09/03/2018 12/01/2017 03/02/2017  PHQ - 2 Score 0 0 0 0  PHQ- 9 Score 0 0 - 0    BP Readings from Last 3 Encounters:  04/14/19 120/80  09/03/18 120/76  12/01/17 132/84    Physical Exam Vitals and nursing note reviewed.  Constitutional:      General: She is not in acute distress.    Appearance: She is not diaphoretic.  HENT:     Head: Normocephalic and atraumatic.     Right Ear: Tympanic membrane, ear canal and external ear normal. There is no impacted cerumen.     Left Ear: Tympanic membrane, ear canal and external ear normal. There is no impacted cerumen.     Nose: Nose normal. No congestion or rhinorrhea.  Eyes:     General:        Right eye: No discharge.        Left eye: No discharge.     Conjunctiva/sclera: Conjunctivae normal.     Pupils: Pupils are equal, round, and reactive to light.  Neck:     Thyroid: No thyromegaly.     Vascular: No carotid bruit or JVD.  Cardiovascular:     Rate and Rhythm: Normal rate and regular rhythm.     Pulses: Normal pulses.     Heart sounds: Normal heart sounds. No murmur. No friction rub. No gallop.   Pulmonary:     Effort: Pulmonary effort is normal. No respiratory distress.     Breath sounds: Normal breath sounds. No stridor. No wheezing or rhonchi.  Abdominal:     General: Bowel sounds are normal.     Palpations: Abdomen is soft. There is no hepatomegaly, splenomegaly or mass.     Tenderness: There is no abdominal tenderness. There is no right CVA tenderness, left CVA tenderness or guarding.  Musculoskeletal:        General: Normal  range of motion.     Cervical back: Normal range of motion and neck supple. No tenderness.  Lymphadenopathy:     Cervical: No cervical adenopathy.  Skin:    General: Skin is warm and dry.  Neurological:     Mental Status: She is alert.     Deep Tendon Reflexes: Reflexes are normal and symmetric.     Wt Readings from Last 3 Encounters:  04/14/19 207 lb (93.9 kg)  09/03/18 208 lb (94.3 kg)  12/01/17 213 lb (96.6 kg)    BP 120/80   Pulse 62   Ht 5\' 4"  (1.626 m)   Wt 207 lb (93.9 kg)   BMI 35.53 kg/m   Assessment and Plan: 1. Essential (primary) hypertension Chronic.  Controlled.  Stable.  Continue amlodipine-benazepril 5-10 mg once a day and hydrochlorothiazide 12.5 mg once a day.  Will check renal function panel to assess GFR.  Patient will return in 6 months for recheck. - Renal Function Panel - amLODipine-benazepril (LOTREL) 5-10 MG capsule; Take 1 capsule by mouth daily.  Dispense: 90 capsule; Refill: 1 - hydrochlorothiazide (HYDRODIURIL)  12.5 MG tablet; Take 1 tablet (12.5 mg total) by mouth daily.  Dispense: 90 tablet; Refill: 1  2. Mixed hyperlipidemia Chronic.  Partially controlled.  Stable.  Patient elevation with elevated triglycerides and LDL but with fairly normal HDL of 50.  Patient was encouraged over the past 7 months to use discretion on her dietary and she is returning today for repeat of her lipid panel.  We have discussed the possibility of medication but hopefully this is going to be in a reasonable range both triglycerides and LDL.  We will check a lipid panel and adjust accordingly. - Lipid Panel With LDL/HDL Ratio  3. Need for pneumococcal vaccination Discussed and administered

## 2019-04-14 NOTE — Patient Instructions (Signed)

## 2019-04-15 LAB — RENAL FUNCTION PANEL
Albumin: 4.5 g/dL (ref 3.8–4.8)
BUN/Creatinine Ratio: 20 (ref 12–28)
BUN: 18 mg/dL (ref 8–27)
CO2: 26 mmol/L (ref 20–29)
Calcium: 10.4 mg/dL — ABNORMAL HIGH (ref 8.7–10.3)
Chloride: 98 mmol/L (ref 96–106)
Creatinine, Ser: 0.9 mg/dL (ref 0.57–1.00)
GFR calc Af Amer: 77 mL/min/{1.73_m2} (ref >59)
GFR calc non Af Amer: 67 mL/min/{1.73_m2} (ref >59)
Glucose: 106 mg/dL — ABNORMAL HIGH (ref 65–99)
Phosphorus: 3.8 mg/dL (ref 3.0–4.3)
Potassium: 4.8 mmol/L (ref 3.5–5.2)
Sodium: 140 mmol/L (ref 134–144)

## 2019-04-15 LAB — LIPID PANEL WITH LDL/HDL RATIO
Cholesterol, Total: 198 mg/dL (ref 100–199)
HDL: 53 mg/dL (ref >39)
LDL Chol Calc (NIH): 112 mg/dL — ABNORMAL HIGH (ref 0–99)
LDL/HDL Ratio: 2.1 ratio (ref 0.0–3.2)
Triglycerides: 187 mg/dL — ABNORMAL HIGH (ref 0–149)
VLDL Cholesterol Cal: 33 mg/dL (ref 5–40)

## 2019-04-28 ENCOUNTER — Other Ambulatory Visit: Payer: Self-pay

## 2019-04-28 ENCOUNTER — Ambulatory Visit
Admission: RE | Admit: 2019-04-28 | Discharge: 2019-04-28 | Disposition: A | Discharge status: Home / Self Care | Payer: Medicare PPO | Source: Ambulatory Visit | Attending: Obstetrics and Gynecology | Admitting: Obstetrics and Gynecology

## 2019-04-28 DIAGNOSIS — Z1231 Encounter for screening mammogram for malignant neoplasm of breast: Secondary | ICD-10-CM | POA: Diagnosis not present

## 2019-07-18 ENCOUNTER — Ambulatory Visit (INDEPENDENT_AMBULATORY_CARE_PROVIDER_SITE_OTHER): Payer: Medicare PPO

## 2019-07-18 DIAGNOSIS — Z0000 Encounter for general adult medical examination without abnormal findings: Secondary | ICD-10-CM

## 2019-07-18 DIAGNOSIS — Z87891 Personal history of nicotine dependence: Secondary | ICD-10-CM

## 2019-07-18 NOTE — Patient Instructions (Signed)
Vanessa Alvarado , Thank you for taking time to come for your Medicare Wellness Visit. I appreciate your ongoing commitment to your health goals. Please review the following plan we discussed and let me know if I can assist you in the future.   Screening recommendations/referrals: Colonoscopy: postponed Mammogram: done 04/28/19 Bone Density: done 01/20/19 Recommended yearly ophthalmology/optometry visit for glaucoma screening and checkup Recommended yearly dental visit for hygiene and checkup  Vaccinations: Influenza vaccine: done 02/01/19 Pneumococcal vaccine: done 04/14/19 Tdap vaccine: done 01/21/16 Shingles vaccine: Shingrix discussed. Please contact your pharmacy for coverage information.  Covid-19: done 06/20/19 & 07/18/19  Advanced directives: Advance directive discussed with you today. Even though you declined this today please call our office should you change your mind and we can give you the proper paperwork for you to fill out.  Conditions/risks identified: Recommend increasing physical activity   Next appointment: Please follow up in one year for your Medicare Annual Wellness visit.     Preventive Care 28 Years and Older, Female Preventive care refers to lifestyle choices and visits with your health care provider that can promote health and wellness. What does preventive care include?  A yearly physical exam. This is also called an annual well check.  Dental exams once or twice a year.  Routine eye exams. Ask your health care provider how often you should have your eyes checked.  Personal lifestyle choices, including:  Daily care of your teeth and gums.  Regular physical activity.  Eating a healthy diet.  Avoiding tobacco and drug use.  Limiting alcohol use.  Practicing safe sex.  Taking low-dose aspirin every day.  Taking vitamin and mineral supplements as recommended by your health care provider. What happens during an annual well check? The services and  screenings done by your health care provider during your annual well check will depend on your age, overall health, lifestyle risk factors, and family history of disease. Counseling  Your health care provider may ask you questions about your:  Alcohol use.  Tobacco use.  Drug use.  Emotional well-being.  Home and relationship well-being.  Sexual activity.  Eating habits.  History of falls.  Memory and ability to understand (cognition).  Work and work Astronomer.  Reproductive health. Screening  You may have the following tests or measurements:  Height, weight, and BMI.  Blood pressure.  Lipid and cholesterol levels. These may be checked every 5 years, or more frequently if you are over 58 years old.  Skin check.  Lung cancer screening. You may have this screening every year starting at age 72 if you have a 30-pack-year history of smoking and currently smoke or have quit within the past 15 years.  Fecal occult blood test (FOBT) of the stool. You may have this test every year starting at age 11.  Flexible sigmoidoscopy or colonoscopy. You may have a sigmoidoscopy every 5 years or a colonoscopy every 10 years starting at age 3.  Hepatitis C blood test.  Hepatitis B blood test.  Sexually transmitted disease (STD) testing.  Diabetes screening. This is done by checking your blood sugar (glucose) after you have not eaten for a while (fasting). You may have this done every 1-3 years.  Bone density scan. This is done to screen for osteoporosis. You may have this done starting at age 69.  Mammogram. This may be done every 1-2 years. Talk to your health care provider about how often you should have regular mammograms. Talk with your health care provider about your  test results, treatment options, and if necessary, the need for more tests. Vaccines  Your health care provider may recommend certain vaccines, such as:  Influenza vaccine. This is recommended every  year.  Tetanus, diphtheria, and acellular pertussis (Tdap, Td) vaccine. You may need a Td booster every 10 years.  Zoster vaccine. You may need this after age 69.  Pneumococcal 13-valent conjugate (PCV13) vaccine. One dose is recommended after age 17.  Pneumococcal polysaccharide (PPSV23) vaccine. One dose is recommended after age 37. Talk to your health care provider about which screenings and vaccines you need and how often you need them. This information is not intended to replace advice given to you by your health care provider. Make sure you discuss any questions you have with your health care provider. Document Released: 03/30/2015 Document Revised: 11/21/2015 Document Reviewed: 01/02/2015 Elsevier Interactive Patient Education  2017 [employer] Prevention in the Home Falls can cause injuries. They can happen to people of all ages. There are many things you can do to make your home safe and to help prevent falls. What can I do on the outside of my home?  Regularly fix the edges of walkways and driveways and fix any cracks.  Remove anything that might make you trip as you walk through a door, such as a raised step or threshold.  Trim any bushes or trees on the path to your home.  Use bright outdoor lighting.  Clear any walking paths of anything that might make someone trip, such as rocks or tools.  Regularly check to see if handrails are loose or broken. Make sure that both sides of any steps have handrails.  Any raised decks and porches should have guardrails on the edges.  Have any leaves, snow, or ice cleared regularly.  Use sand or salt on walking paths during winter.  Clean up any spills in your garage right away. This includes oil or grease spills. What can I do in the bathroom?  Use night lights.  Install grab bars by the toilet and in the tub and shower. Do not use towel bars as grab bars.  Use non-skid mats or decals in the tub or shower.  If you  need to sit down in the shower, use a plastic, non-slip stool.  Keep the floor dry. Clean up any water that spills on the floor as soon as it happens.  Remove soap buildup in the tub or shower regularly.  Attach bath mats securely with double-sided non-slip rug tape.  Do not have throw rugs and other things on the floor that can make you trip. What can I do in the bedroom?  Use night lights.  Make sure that you have a light by your bed that is easy to reach.  Do not use any sheets or blankets that are too big for your bed. They should not hang down onto the floor.  Have a firm chair that has side arms. You can use this for support while you get dressed.  Do not have throw rugs and other things on the floor that can make you trip. What can I do in the kitchen?  Clean up any spills right away.  Avoid walking on wet floors.  Keep items that you use a lot in easy-to-reach places.  If you need to reach something above you, use a strong step stool that has a grab bar.  Keep electrical cords out of the way.  Do not use floor polish or wax that  makes floors slippery. If you must use wax, use non-skid floor wax.  Do not have throw rugs and other things on the floor that can make you trip. What can I do with my stairs?  Do not leave any items on the stairs.  Make sure that there are handrails on both sides of the stairs and use them. Fix handrails that are broken or loose. Make sure that handrails are as long as the stairways.  Check any carpeting to make sure that it is firmly attached to the stairs. Fix any carpet that is loose or worn.  Avoid having throw rugs at the top or bottom of the stairs. If you do have throw rugs, attach them to the floor with carpet tape.  Make sure that you have a light switch at the top of the stairs and the bottom of the stairs. If you do not have them, ask someone to add them for you. What else can I do to help prevent falls?  Wear shoes  that:  Do not have high heels.  Have rubber bottoms.  Are comfortable and fit you well.  Are closed at the toe. Do not wear sandals.  If you use a stepladder:  Make sure that it is fully opened. Do not climb a closed stepladder.  Make sure that both sides of the stepladder are locked into place.  Ask someone to hold it for you, if possible.  Clearly mark and make sure that you can see:  Any grab bars or handrails.  First and last steps.  Where the edge of each step is.  Use tools that help you move around (mobility aids) if they are needed. These include:  Canes.  Walkers.  Scooters.  Crutches.  Turn on the lights when you go into a dark area. Replace any light bulbs as soon as they burn out.  Set up your furniture so you have a clear path. Avoid moving your furniture around.  If any of your floors are uneven, fix them.  If there are any pets around you, be aware of where they are.  Review your medicines with your doctor. Some medicines can make you feel dizzy. This can increase your chance of falling. Ask your doctor what other things that you can do to help prevent falls. This information is not intended to replace advice given to you by your health care provider. Make sure you discuss any questions you have with your health care provider. Document Released: 12/28/2008 Document Revised: 08/09/2015 Document Reviewed: 04/07/2014 Elsevier Interactive Patient Education  2017 [employer].

## 2019-07-18 NOTE — Progress Notes (Addendum)
Subjective:   Vanessa Alvarado is a 69 y.o. female who presents for an Initial Medicare Annual Wellness Visit.  Virtual Visit via Telephone Note  I connected with  Vanessa Alvarado on 07/18/19 at  8:00 AM EDT by telephone and verified that I am speaking with the correct person using two identifiers.  Medicare Annual Wellness visit completed telephonically due to Covid-19 pandemic.   Location: Patient: home Provider: office   I discussed the limitations, risks, security and privacy concerns of performing an evaluation and management service by telephone and the availability of in person appointments. The patient expressed understanding and agreed to proceed.  Unable to perform video visit due to patient does not have video capability.   Some vital signs may be absent or patient reported.   Clemetine Marker, LPN    Review of Systems     Cardiac Risk Factors include: advanced age (>1men, >68 women);hypertension     Objective:    There were no vitals filed for this visit. There is no height or weight on file to calculate BMI.  Advanced Directives 07/18/2019 07/21/2016 06/01/2015  Does Patient Have a Medical Advance Directive? No No No  Would patient like information on creating a medical advance directive? No - Patient declined - -    Current Medications (verified) Outpatient Encounter Medications as of 07/18/2019  Medication Sig  . amLODipine-benazepril (LOTREL) 5-10 MG capsule Take 1 capsule by mouth daily.  . calcium-vitamin D (OSCAL WITH D) 500-200 MG-UNIT tablet Take 1 tablet by mouth.  . cetirizine (ZYRTEC) 10 MG tablet Take 10 mg by mouth daily.  . hydrochlorothiazide (HYDRODIURIL) 12.5 MG tablet Take 1 tablet (12.5 mg total) by mouth daily.   No facility-administered encounter medications on file as of 07/18/2019.    Allergies (verified) Patient has no known allergies.   History: Past Medical History:  Diagnosis Date  . Depression   . Hypertension    Past Surgical  History:  Procedure Laterality Date  . herniated disc     lower back  . OVARIAN CYST REMOVAL     Family History  Problem Relation Age of Onset  . Heart disease Father   . Breast cancer Neg Hx    Social History   Socioeconomic History  . Marital status: Married    Spouse name: Not on file  . Number of children: 2  . Years of education: Not on file  . Highest education level: Associate degree: academic program  Occupational History  . Not on file  Tobacco Use  . Smoking status: Former Research scientist (life sciences)  . Smokeless tobacco: Never Used  Substance and Sexual Activity  . Alcohol use: Yes    Alcohol/week: 0.0 standard drinks  . Drug use: No  . Sexual activity: Yes  Other Topics Concern  . Not on file  Social History Narrative  . Not on file   Social Determinants of Health   Financial Resource Strain: Low Risk   . Difficulty of Paying Living Expenses: Not hard at all  Food Insecurity: No Food Insecurity  . Worried About Charity fundraiser in the Last Year: Never true  . Ran Out of Food in the Last Year: Never true  Transportation Needs: No Transportation Needs  . Lack of Transportation (Medical): No  . Lack of Transportation (Non-Medical): No  Physical Activity: Inactive  . Days of Exercise per Week: 0 days  . Minutes of Exercise per Session: 0 min  Stress: No Stress Concern Present  . Feeling  of Stress : Not at all  Social Connections: Somewhat Isolated  . Frequency of Communication with Friends and Family: More than three times a week  . Frequency of Social Gatherings with Friends and Family: More than three times a week  . Attends Religious Services: Never  . Active Member of Clubs or Organizations: No  . Attends Banker Meetings: Never  . Marital Status: Married    Tobacco Counseling Counseling given: Not Answered   Clinical Intake:  Pre-visit preparation completed: Yes  Pain : No/denies pain     Nutritional Risks: None Diabetes: No  How often  do you need to have someone help you when you read instructions, pamphlets, or other written materials from your doctor or pharmacy?: 1 - Never  Interpreter Needed?: No  Information entered by :: Reather Littler LPN   Activities of Daily Living In your present state of health, do you have any difficulty performing the following activities: 07/18/2019  Hearing? N  Comment declines hearing aids  Vision? N  Difficulty concentrating or making decisions? N  Walking or climbing stairs? N  Dressing or bathing? N  Doing errands, shopping? N  Preparing Food and eating ? N  Using the Toilet? N  In the past six months, have you accidently leaked urine? N  Do you have problems with loss of bowel control? N  Managing your Medications? N  Managing your Finances? N  Housekeeping or managing your Housekeeping? N  Some recent data might be hidden     Immunizations and Health Maintenance Immunization History  Administered Date(s) Administered  . Influenza Split 12/26/2010  . Influenza, High Dose Seasonal PF 12/01/2017  . Influenza-Unspecified 11/23/2014, 12/18/2016, 12/19/2016, 02/01/2019  . Moderna SARS-COVID-2 Vaccination 06/20/2019, 07/18/2019  . Pneumococcal Conjugate-13 12/01/2017  . Pneumococcal Polysaccharide-23 01/10/2011, 04/14/2019  . Tdap 01/21/2016   There are no preventive care reminders to display for this patient.  Patient Care Team: Duanne Limerick, MD as PCP - General (Family Medicine)  Indicate any recent Medical Services you may have received from other than [facility] providers in the past year (date may be approximate).     Assessment:   This is a routine wellness examination for Vanessa Alvarado.  Hearing/Vision screen  Hearing Screening   125Hz  250Hz  500Hz  1000Hz  2000Hz  3000Hz  4000Hz  6000Hz  8000Hz   Right ear:           Left ear:           Comments: Pt denies hearing difficulty  Vision Screening Comments: Past due for eye exam. Established with Dr[facility] at [facility]  Dietary  issues and exercise activities discussed: Current Exercise Habits: The patient does not participate in regular exercise at present, Exercise limited by: None identified  Goals   None    Depression Screen PHQ 2/9 Scores 07/18/2019 04/14/2019 09/03/2018 12/01/2017 03/02/2017 07/21/2016 08/28/2014  PHQ - 2 Score 0 0 0 0 0 0 0  PHQ- 9 Score - 0 0 - 0 0 -    Fall Risk Fall Risk  07/18/2019 04/14/2019 12/01/2017 08/28/2014  Falls in the past year? 0 0 No No  Number falls in past yr: 0 - - -  Injury with Fall? 0 - - -  Risk for fall due to : No Fall Risks - - -  Follow up Falls prevention discussed Falls evaluation completed - -    FALL RISK PREVENTION PERTAINING TO THE HOME:  Any stairs in or around the home? Yes  If so, do they handrails? Yes  Home free of loose throw rugs in walkways, pet beds, electrical cords, etc? Yes  Adequate lighting in your home to reduce risk of falls? Yes   ASSISTIVE DEVICES UTILIZED TO PREVENT FALLS:  Life alert? No  Use of a cane, walker or w/c? No  Grab bars in the bathroom? Yes  Shower chair or bench in shower? Yes Elevated toilet seat or a handicapped toilet? No   DME ORDERS:  DME order needed?  No   TIMED UP AND GO:  Was the test performed? No . Telephonic visit.   Education: Fall risk prevention has been discussed.  Intervention(s) required? No   Cognitive Function: pt declines 6CIT for 2021 AWV; pt has no memory issues.         Screening Tests Health Maintenance  Topic Date Due  . COLONOSCOPY  09/03/2019 (Originally [birth year]/2002)  . Hepatitis C Screening  04/13/2020 (Originally 1950/05/26)  . INFLUENZA VACCINE  10/16/2019  . MAMMOGRAM  04/27/2021  . TETANUS/TDAP  01/20/2026  . DEXA SCAN  Completed  . COVID-19 Vaccine  Completed  . PNA vac Low Risk Adult  Completed    Qualifies for Shingles Vaccine? Yes . Due for Shingrix. Education has been provided regarding the importance of this vaccine. Pt has been advised to call insurance company  to determine out of pocket expense. Advised may also receive vaccine at local pharmacy or Health Dept. Verbalized acceptance and understanding.  Tdap: Up to date  Flu Vaccine: Up to date  Pneumococcal Vaccine: Up to date  Covid-19 Vaccine: Up to date   Cancer Screenings:  Colorectal Screening: Not completed. Pt declined at this time.   Mammogram: Completed 04/28/19. Repeat every year.   Bone Density: Completed 01/20/19. Results reflect OSTEOPENIA. Repeat every 2 years.   Lung Cancer Screening: (Low Dose CT Chest recommended if Age 105-80 years, 30 pack-year currently smoking OR have quit w/in 15years.) does not qualify.   Additional Screening:  Hepatitis C Screening: does qualify; postponed  Vision Screening: Recommended annual ophthalmology exams for early detection of glaucoma and other disorders of the eye. Is the patient up to date with their annual eye exam?  No  Who is the provider or what is the name of the office in which the pt attends annual eye exams? Dr. Clearance Coots  Dental Screening: Recommended annual dental exams for proper oral hygiene  Community Resource Referral:  CRR required this visit?  No      Plan:    I have personally reviewed and addressed the Medicare Annual Wellness questionnaire and have noted the following in the patient's chart:  A. Medical and social history B. Use of alcohol, tobacco or illicit drugs  C. Current medications and supplements D. Functional ability and status E.  Nutritional status F.  Physical activity G. Advance directives H. List of other physicians I.  Hospitalizations, surgeries, and ER visits in previous 12 months J.  Vitals K. Screenings such as hearing and vision if needed, cognitive and depression L. Referrals and appointments   In addition, I have reviewed and discussed with patient certain preventive protocols, quality metrics, and best practice recommendations. A written personalized care plan for preventive services  as well as general preventive health recommendations were provided to patient.   Signed,   Reather Littler, LPN Nurse Health Advisor   Nurse Notes: pt doing well and appreciative of visit today.

## 2019-09-28 ENCOUNTER — Ambulatory Visit: Payer: Medicare PPO | Admitting: Family Medicine

## 2019-10-12 ENCOUNTER — Encounter: Payer: Self-pay | Admitting: Family Medicine

## 2019-10-12 ENCOUNTER — Other Ambulatory Visit: Payer: Self-pay

## 2019-10-12 ENCOUNTER — Ambulatory Visit: Payer: Medicare PPO | Admitting: Family Medicine

## 2019-10-12 VITALS — BP 120/80 | HR 64 | Ht 64.0 in | Wt 205.0 lb

## 2019-10-12 DIAGNOSIS — Z1211 Encounter for screening for malignant neoplasm of colon: Secondary | ICD-10-CM

## 2019-10-12 DIAGNOSIS — I10 Essential (primary) hypertension: Secondary | ICD-10-CM

## 2019-10-12 MED ORDER — AMLODIPINE BESY-BENAZEPRIL HCL 5-10 MG PO CAPS: 1.0000 | ORAL_CAPSULE | Freq: Every day | ORAL | 1 refills | Status: DC

## 2019-10-12 MED ORDER — HYDROCHLOROTHIAZIDE 12.5 MG PO TABS: 12.5000 mg | ORAL_TABLET | Freq: Every day | ORAL | 1 refills | Status: DC

## 2019-10-12 NOTE — Progress Notes (Signed)
Date:  10/12/2019   Name:  Vanessa Alvarado   DOB:  [birth year]   MRN:  [id number]   Chief Complaint: Hypertension  Hypertension This is a chronic problem. The current episode started more than 1 year ago. The problem has been gradually improving since onset. The problem is controlled. Pertinent negatives include no anxiety, blurred vision, chest pain, headaches, malaise/fatigue, neck pain, orthopnea, palpitations, peripheral edema, PND, shortness of breath or sweats. There are no associated agents to hypertension. Past treatments include calcium channel blockers, ACE inhibitors and diuretics. The current treatment provides moderate improvement. There are no compliance problems.  There is no history of angina, kidney disease, CAD/MI, CVA, heart failure, left ventricular hypertrophy, PVD or retinopathy. There is no history of chronic renal disease, a hypertension causing med or renovascular disease.    Lab Results  Component Value Date   CREATININE 0.90 04/14/2019   BUN 18 04/14/2019   NA 140 04/14/2019   K 4.8 04/14/2019   CL 98 04/14/2019   CO2 26 04/14/2019   Lab Results  Component Value Date   CHOL 198 04/14/2019   HDL 53 04/14/2019   LDLCALC 112 (H) 04/14/2019   TRIG 187 (H) 04/14/2019   CHOLHDL 3.9 09/03/2018   No results found for: TSH No results found for: HGBA1C No results found for: WBC, HGB, HCT, MCV, PLT No results found for: ALT, AST, GGT, ALKPHOS, BILITOT   Review of Systems  Constitutional: Negative for chills, fever and malaise/fatigue.  HENT: Negative for drooling, ear discharge, ear pain and sore throat.   Eyes: Negative for blurred vision.  Respiratory: Negative for cough, shortness of breath and wheezing.   Cardiovascular: Negative for chest pain, palpitations, orthopnea, leg swelling and PND.  Gastrointestinal: Negative for abdominal pain, blood in stool, constipation, diarrhea and nausea.  Endocrine: Negative for polydipsia.  Genitourinary: Negative  for dysuria, frequency, hematuria and urgency.  Musculoskeletal: Negative for back pain, myalgias and neck pain.  Skin: Negative for rash.  Allergic/Immunologic: Negative for environmental allergies.  Neurological: Negative for dizziness and headaches.  Hematological: Does not bruise/bleed easily.  Psychiatric/Behavioral: Negative for suicidal ideas. The patient is not nervous/anxious.     Patient Active Problem List   Diagnosis Date Noted  . Mixed hyperlipidemia 04/14/2019  . Psoriasis 08/28/2014  . Allergic rhinitis, seasonal 08/28/2014  . BP (high blood pressure) 08/25/2014  . Essential (primary) hypertension 09/05/2013  . Pleurisy 09/05/2013    No Known Allergies  Past Surgical History:  Procedure Laterality Date  . herniated disc     lower back  . OVARIAN CYST REMOVAL      Social History   Tobacco Use  . Smoking status: Former Games developer  . Smokeless tobacco: Never Used  Substance Use Topics  . Alcohol use: Yes    Alcohol/week: 0.0 standard drinks  . Drug use: No     Medication list has been reviewed and updated.  Current Meds  Medication Sig  . amLODipine-benazepril (LOTREL) 5-10 MG capsule Take 1 capsule by mouth daily.  . calcium-vitamin D (OSCAL WITH D) 500-200 MG-UNIT tablet Take 1 tablet by mouth.  . cetirizine (ZYRTEC) 10 MG tablet Take 10 mg by mouth daily.  . hydrochlorothiazide (HYDRODIURIL) 12.5 MG tablet Take 1 tablet (12.5 mg total) by mouth daily.    PHQ 2/9 Scores 10/12/2019 07/18/2019 04/14/2019 09/03/2018  PHQ - 2 Score 0 0 0 0  PHQ- 9 Score 1 - 0 0    GAD 7 : Generalized Anxiety Score  10/12/2019 04/14/2019  Nervous, Anxious, on Edge 0 0  Control/stop worrying 0 0  Worry too much - different things 0 0  Trouble relaxing 0 0  Restless 0 0  Easily annoyed or irritable 0 0  Afraid - awful might happen 0 0  Total GAD 7 Score 0 0    BP Readings from Last 3 Encounters:  10/12/19 120/80  04/14/19 120/80  09/03/18 120/76    Physical  Exam Vitals and nursing note reviewed.  Constitutional:      Appearance: She is well-developed.  HENT:     Head: Normocephalic.     Right Ear: Tympanic membrane, ear canal and external ear normal.     Left Ear: Tympanic membrane, ear canal and external ear normal.     Nose: Nose normal. No congestion or rhinorrhea.     Mouth/Throat:     Mouth: Mucous membranes are moist.  Eyes:     General: Lids are everted, no foreign bodies appreciated. No scleral icterus.       Left eye: No foreign body or hordeolum.     Conjunctiva/sclera: Conjunctivae normal.     Right eye: Right conjunctiva is not injected.     Left eye: Left conjunctiva is not injected.     Pupils: Pupils are equal, round, and reactive to light.  Neck:     Thyroid: No thyromegaly.     Vascular: No JVD.     Trachea: No tracheal deviation.  Cardiovascular:     Rate and Rhythm: Normal rate and regular rhythm.     Pulses: Normal pulses.     Heart sounds: Normal heart sounds. No murmur heard.  No friction rub. No gallop.   Pulmonary:     Effort: Pulmonary effort is normal. No respiratory distress.     Breath sounds: Normal breath sounds. No wheezing, rhonchi or rales.  Abdominal:     General: Bowel sounds are normal.     Palpations: Abdomen is soft. There is no mass.     Tenderness: There is no abdominal tenderness. There is no guarding or rebound.  Musculoskeletal:        General: No tenderness. Normal range of motion.     Cervical back: Normal range of motion and neck supple.  Lymphadenopathy:     Cervical: No cervical adenopathy.  Skin:    General: Skin is warm.     Findings: No rash.  Neurological:     Mental Status: She is alert and oriented to person, place, and time.     Cranial Nerves: No cranial nerve deficit.     Deep Tendon Reflexes: Reflexes normal.  Psychiatric:        Mood and Affect: Mood normal. Mood is not anxious or depressed.     Wt Readings from Last 3 Encounters:  10/12/19 (!) 205 lb (93 kg)   04/14/19 207 lb (93.9 kg)  09/03/18 208 lb (94.3 kg)    BP 120/80   Pulse 64   Ht 5\' 4"  (1.626 m)   Wt (!) 205 lb (93 kg)   BMI 35.19 kg/m   Assessment and Plan: 1. Colon cancer screening Discussed with patient.  Patient has agreed to do an FIT test for evaluation of fecal occult blood. - Fecal occult blood, imunochemical([facility])  2. Essential (primary) hypertension Chronic.  Controlled.  Stable.  Continue amlodipine benazepril 5-10 mg daily as well as hydrochlorothiazide 12.5 mg once a day.  Review of January's renal function panel was acceptable.  Will recheck in  6 months. - amLODipine-benazepril (LOTREL) 5-10 MG capsule; Take 1 capsule by mouth daily.  Dispense: 90 capsule; Refill: 1 - hydrochlorothiazide (HYDRODIURIL) 12.5 MG tablet; Take 1 tablet (12.5 mg total) by mouth daily.  Dispense: 90 tablet; Refill: 1  Patient given weight loss she and diet was discussed with patient for return appointment in 6 months with a goal of losing 10 pounds.

## 2019-10-12 NOTE — Patient Instructions (Signed)

## 2019-11-03 DIAGNOSIS — Z1211 Encounter for screening for malignant neoplasm of colon: Secondary | ICD-10-CM | POA: Diagnosis not present

## 2019-11-05 LAB — FECAL OCCULT BLOOD, IMMUNOCHEMICAL: Fecal Occult Bld: NEGATIVE

## 2019-11-30 DIAGNOSIS — H02825 Cysts of left lower eyelid: Secondary | ICD-10-CM | POA: Diagnosis not present

## 2020-01-12 ENCOUNTER — Other Ambulatory Visit: Payer: Self-pay

## 2020-01-12 ENCOUNTER — Ambulatory Visit (INDEPENDENT_AMBULATORY_CARE_PROVIDER_SITE_OTHER): Payer: Medicare PPO

## 2020-01-12 DIAGNOSIS — Z23 Encounter for immunization: Secondary | ICD-10-CM

## 2020-04-16 ENCOUNTER — Encounter: Payer: Self-pay | Admitting: Family Medicine

## 2020-04-16 ENCOUNTER — Ambulatory Visit: Payer: Medicare PPO | Admitting: Family Medicine

## 2020-04-16 ENCOUNTER — Other Ambulatory Visit: Payer: Self-pay

## 2020-04-16 VITALS — BP 112/80 | HR 80 | Ht 64.0 in | Wt 210.0 lb

## 2020-04-16 DIAGNOSIS — M8580 Other specified disorders of bone density and structure, unspecified site: Secondary | ICD-10-CM

## 2020-04-16 DIAGNOSIS — I10 Essential (primary) hypertension: Secondary | ICD-10-CM | POA: Diagnosis not present

## 2020-04-16 DIAGNOSIS — E782 Mixed hyperlipidemia: Secondary | ICD-10-CM

## 2020-04-16 MED ORDER — AMLODIPINE BESY-BENAZEPRIL HCL 5-10 MG PO CAPS: 1.0000 | ORAL_CAPSULE | Freq: Every day | ORAL | 1 refills | Status: DC

## 2020-04-16 MED ORDER — HYDROCHLOROTHIAZIDE 12.5 MG PO TABS: 12.5000 mg | ORAL_TABLET | Freq: Every day | ORAL | 1 refills | Status: DC

## 2020-04-16 MED ORDER — CALCIUM CARBONATE-VITAMIN D 500-200 MG-UNIT PO TABS: 1.0000 | ORAL_TABLET | Freq: Every day | ORAL | 3 refills | Status: AC

## 2020-04-16 NOTE — Progress Notes (Signed)
Date:  04/16/2020   Name:  Vanessa Alvarado   DOB:  10/17/1950   MRN:  [id number]   Chief Complaint: Hypertension  Hypertension This is a chronic problem. The current episode started more than 1 year ago. The problem has been gradually improving since onset. The problem is controlled. Pertinent negatives include no anxiety, blurred vision, chest pain, headaches, malaise/fatigue, neck pain, orthopnea, palpitations, peripheral edema, PND, shortness of breath or sweats. There are no associated agents to hypertension. Risk factors for coronary artery disease include dyslipidemia. Past treatments include calcium channel blockers, ACE inhibitors and diuretics. The current treatment provides moderate improvement. There are no compliance problems.  There is no history of angina, kidney disease, CAD/MI, CVA, heart failure, left ventricular hypertrophy, PVD or retinopathy. There is no history of chronic renal disease, a hypertension causing med or renovascular disease.  Hyperlipidemia This is a chronic problem. The current episode started more than 1 year ago. The problem is uncontrolled. Recent lipid tests were reviewed and are normal. She has no history of chronic renal disease. Pertinent negatives include no chest pain, myalgias or shortness of breath. Current antihyperlipidemic treatment includes diet change. The current treatment provides no improvement of lipids.    Lab Results  Component Value Date   CREATININE 0.90 04/14/2019   BUN 18 04/14/2019   NA 140 04/14/2019   K 4.8 04/14/2019   CL 98 04/14/2019   CO2 26 04/14/2019   Lab Results  Component Value Date   CHOL 198 04/14/2019   HDL 53 04/14/2019   LDLCALC 112 (H) 04/14/2019   TRIG 187 (H) 04/14/2019   CHOLHDL 3.9 09/03/2018   No results found for: TSH No results found for: HGBA1C No results found for: WBC, HGB, HCT, MCV, PLT No results found for: ALT, AST, GGT, ALKPHOS, BILITOT   Review of Systems  Constitutional: Negative.   Negative for chills, fatigue, fever, malaise/fatigue and unexpected weight change.  HENT: Negative for congestion, ear discharge, ear pain, rhinorrhea, sinus pressure, sneezing and sore throat.   Eyes: Negative for blurred vision, double vision, photophobia, pain, discharge, redness and itching.  Respiratory: Negative for cough, shortness of breath, wheezing and stridor.   Cardiovascular: Negative for chest pain, palpitations, orthopnea and PND.  Gastrointestinal: Negative for abdominal pain, blood in stool, constipation, diarrhea, nausea and vomiting.  Endocrine: Negative for cold intolerance, heat intolerance, polydipsia, polyphagia and polyuria.  Genitourinary: Negative for dysuria, flank pain, frequency, hematuria, menstrual problem, pelvic pain, urgency, vaginal bleeding and vaginal discharge.  Musculoskeletal: Negative for arthralgias, back pain, myalgias and neck pain.  Skin: Negative for rash.  Allergic/Immunologic: Negative for environmental allergies and food allergies.  Neurological: Negative for dizziness, weakness, light-headedness, numbness and headaches.  Hematological: Negative for adenopathy. Does not bruise/bleed easily.  Psychiatric/Behavioral: Negative for dysphoric mood. The patient is not nervous/anxious.     Patient Active Problem List   Diagnosis Date Noted  . Mixed hyperlipidemia 04/14/2019  . Psoriasis 08/28/2014  . Allergic rhinitis, seasonal 08/28/2014  . BP (high blood pressure) 08/25/2014  . Essential (primary) hypertension 09/05/2013  . Pleurisy 09/05/2013    No Known Allergies  Past Surgical History:  Procedure Laterality Date  . herniated disc     lower back  . OVARIAN CYST REMOVAL      Social History   Tobacco Use  . Smoking status: Former Games developer  . Smokeless tobacco: Never Used  Substance Use Topics  . Alcohol use: Yes    Alcohol/week: 0.0 standard drinks  .  Drug use: No     Medication list has been reviewed and updated.  Current  Meds  Medication Sig  . amLODipine-benazepril (LOTREL) 5-10 MG capsule Take 1 capsule by mouth daily.  . calcium-vitamin D (OSCAL WITH D) 500-200 MG-UNIT tablet Take 1 tablet by mouth.  . cetirizine (ZYRTEC) 10 MG tablet Take 10 mg by mouth daily.  . hydrochlorothiazide (HYDRODIURIL) 12.5 MG tablet Take 1 tablet (12.5 mg total) by mouth daily.    PHQ 2/9 Scores 04/16/2020 10/12/2019 07/18/2019 04/14/2019  PHQ - 2 Score 0 0 0 0  PHQ- 9 Score 0 1 - 0    GAD 7 : Generalized Anxiety Score 04/16/2020 10/12/2019 04/14/2019  Nervous, Anxious, on Edge 0 0 0  Control/stop worrying 0 0 0  Worry too much - different things 0 0 0  Trouble relaxing 0 0 0  Restless 0 0 0  Easily annoyed or irritable 0 0 0  Afraid - awful might happen 0 0 0  Total GAD 7 Score 0 0 0    BP Readings from Last 3 Encounters:  04/16/20 112/80  10/12/19 120/80  04/14/19 120/80    Physical Exam Vitals and nursing note reviewed.  Constitutional:      Appearance: She is well-developed and well-nourished.  HENT:     Head: Normocephalic.     Right Ear: External ear normal.     Left Ear: External ear normal.     Mouth/Throat:     Mouth: Oropharynx is clear and moist.  Eyes:     General: Lids are everted, no foreign bodies appreciated. No scleral icterus.       Left eye: No foreign body or hordeolum.     Extraocular Movements: EOM normal.     Conjunctiva/sclera: Conjunctivae normal.     Right eye: Right conjunctiva is not injected.     Left eye: Left conjunctiva is not injected.     Pupils: Pupils are equal, round, and reactive to light.  Neck:     Thyroid: No thyromegaly.     Vascular: No JVD.     Trachea: No tracheal deviation.  Cardiovascular:     Rate and Rhythm: Normal rate and regular rhythm.     Pulses: Intact distal pulses.     Heart sounds: Normal heart sounds. No murmur heard. No friction rub. No gallop.   Pulmonary:     Effort: Pulmonary effort is normal. No respiratory distress.     Breath sounds:  Normal breath sounds. No wheezing or rales.  Abdominal:     General: Bowel sounds are normal.     Palpations: Abdomen is soft. There is no hepatosplenomegaly or mass.     Tenderness: There is no abdominal tenderness. There is no guarding or rebound.  Musculoskeletal:        General: No tenderness or edema. Normal range of motion.     Cervical back: Normal range of motion and neck supple.  Lymphadenopathy:     Cervical: No cervical adenopathy.  Skin:    General: Skin is warm.     Findings: No rash.  Neurological:     Mental Status: She is alert and oriented to person, place, and time.     Cranial Nerves: No cranial nerve deficit.     Deep Tendon Reflexes: Strength normal. Reflexes normal.  Psychiatric:        Mood and Affect: Mood and affect normal. Mood is not anxious or depressed.     Wt Readings from Last 3 Encounters:  04/16/20 210 lb (95.3 kg)  10/12/19 (!) 205 lb (93 kg)  04/14/19 207 lb (93.9 kg)    BP 112/80   Pulse 80   Ht 5\' 4"  (1.626 m)   Wt 210 lb (95.3 kg)   BMI 36.05 kg/m   Assessment and Plan: 1. Essential (primary) hypertension Chronic.  Controlled.  Stable.  Blood pressure is excellent at 112/80.  Patient will continue hydrochlorothiazide 12.5 mg once a day.  Angelo 12 5-10 mg once a day.  Will recheck blood pressure in 6 months.  Will check renal function panel. - Renal Function Panel - amLODipine-benazepril (LOTREL) 5-10 MG capsule; Take 1 capsule by mouth daily.  Dispense: 90 capsule; Refill: 1 - hydrochlorothiazide (HYDRODIURIL) 12.5 MG tablet; Take 1 tablet (12.5 mg total) by mouth daily.  Dispense: 90 tablet; Refill: 1  2. Moderate mixed hyperlipidemia not requiring statin therapy Chronic.  Controlled.  Mild elevation of the LDL.  Patient is not fasting today but we will take that in consideration that she is here today for labs.  Patient has been given in anticipation a low-cholesterol triglyceride guidelines and have been successful in the past  helping.  Patient is to lose weight. - Lipid Panel With LDL/HDL Ratio  3. Osteopenia, unspecified location Chronic.  Controlled.  Stable.  Review of patient's DEXA scan notes a left femur of -1.2 consistent with osteopenia.  Patient is to continue her vitamin D and calcium supplementation. - calcium-vitamin D (OSCAL WITH D) 500-200 MG-UNIT tablet; Take 1 tablet by mouth daily with breakfast.  Dispense: 100 tablet; Refill: 3

## 2020-04-16 NOTE — Patient Instructions (Signed)

## 2020-04-17 LAB — RENAL FUNCTION PANEL
Albumin: 4.7 g/dL (ref 3.8–4.8)
BUN/Creatinine Ratio: 20 (ref 12–28)
BUN: 19 mg/dL (ref 8–27)
CO2: 24 mmol/L (ref 20–29)
Calcium: 10.2 mg/dL (ref 8.7–10.3)
Chloride: 100 mmol/L (ref 96–106)
Creatinine, Ser: 0.93 mg/dL (ref 0.57–1.00)
GFR calc Af Amer: 74 mL/min/{1.73_m2} (ref >59)
GFR calc non Af Amer: 64 mL/min/{1.73_m2} (ref >59)
Glucose: 128 mg/dL — ABNORMAL HIGH (ref 65–99)
Phosphorus: 3.2 mg/dL (ref 3.0–4.3)
Potassium: 4 mmol/L (ref 3.5–5.2)
Sodium: 141 mmol/L (ref 134–144)

## 2020-04-17 LAB — LIPID PANEL WITH LDL/HDL RATIO
Cholesterol, Total: 206 mg/dL — ABNORMAL HIGH (ref 100–199)
HDL: 53 mg/dL (ref >39)
LDL Chol Calc (NIH): 119 mg/dL — ABNORMAL HIGH (ref 0–99)
LDL/HDL Ratio: 2.2 ratio (ref 0.0–3.2)
Triglycerides: 195 mg/dL — ABNORMAL HIGH (ref 0–149)
VLDL Cholesterol Cal: 34 mg/dL (ref 5–40)

## 2020-06-05 DIAGNOSIS — D487 Neoplasm of uncertain behavior of other specified sites: Secondary | ICD-10-CM | POA: Diagnosis not present

## 2020-06-05 DIAGNOSIS — D23121 Other benign neoplasm of skin of left upper eyelid, including canthus: Secondary | ICD-10-CM | POA: Diagnosis not present

## 2020-06-26 DIAGNOSIS — Z124 Encounter for screening for malignant neoplasm of cervix: Secondary | ICD-10-CM | POA: Diagnosis not present

## 2020-06-26 DIAGNOSIS — Z1231 Encounter for screening mammogram for malignant neoplasm of breast: Secondary | ICD-10-CM | POA: Diagnosis not present

## 2020-06-26 DIAGNOSIS — Z01419 Encounter for gynecological examination (general) (routine) without abnormal findings: Secondary | ICD-10-CM | POA: Diagnosis not present

## 2020-07-02 ENCOUNTER — Other Ambulatory Visit: Payer: Self-pay | Admitting: Obstetrics and Gynecology

## 2020-07-02 DIAGNOSIS — Z1231 Encounter for screening mammogram for malignant neoplasm of breast: Secondary | ICD-10-CM

## 2020-07-11 ENCOUNTER — Ambulatory Visit
Admission: RE | Admit: 2020-07-11 | Discharge: 2020-07-11 | Disposition: A | Discharge status: Home / Self Care | Payer: Medicare PPO | Source: Ambulatory Visit | Attending: Obstetrics and Gynecology | Admitting: Obstetrics and Gynecology

## 2020-07-11 ENCOUNTER — Other Ambulatory Visit: Payer: Self-pay

## 2020-07-11 DIAGNOSIS — Z1231 Encounter for screening mammogram for malignant neoplasm of breast: Secondary | ICD-10-CM | POA: Diagnosis not present

## 2020-07-17 DIAGNOSIS — D487 Neoplasm of uncertain behavior of other specified sites: Secondary | ICD-10-CM | POA: Diagnosis not present

## 2020-07-17 DIAGNOSIS — D485 Neoplasm of uncertain behavior of skin: Secondary | ICD-10-CM | POA: Diagnosis not present

## 2020-07-17 DIAGNOSIS — D23122 Other benign neoplasm of skin of left lower eyelid, including canthus: Secondary | ICD-10-CM | POA: Diagnosis not present

## 2020-07-17 DIAGNOSIS — D23121 Other benign neoplasm of skin of left upper eyelid, including canthus: Secondary | ICD-10-CM | POA: Diagnosis not present

## 2020-07-18 ENCOUNTER — Ambulatory Visit (INDEPENDENT_AMBULATORY_CARE_PROVIDER_SITE_OTHER): Payer: Medicare PPO

## 2020-07-18 DIAGNOSIS — Z0000 Encounter for general adult medical examination without abnormal findings: Secondary | ICD-10-CM

## 2020-07-18 NOTE — Patient Instructions (Signed)
Vanessa Alvarado , Thank you for taking time to come for your Medicare Wellness Visit. I appreciate your ongoing commitment to your health goals. Please review the following plan we discussed and let me know if I can assist you in the future.   Screening recommendations/referrals: Colonoscopy: FOBT done 11/03/19 Mammogram: done 07/11/20 Bone Density: done 01/20/19 Recommended yearly ophthalmology/optometry visit for glaucoma screening and checkup Recommended yearly dental visit for hygiene and checkup  Vaccinations: Influenza vaccine: done 01/12/20 Pneumococcal vaccine: done 04/14/19 Tdap vaccine: done 01/21/16 Shingles vaccine: Shingrix discussed. Please contact your pharmacy for coverage information.  Covid-19: done 06/20/19, 53/21 & 03/01/20  Advanced directives: Please bring a copy of your health care power of attorney and living will to the office at your convenience once you have completed those documents.   Conditions/risks identified: Recommend increasing physical activity   Next appointment: Follow up in one year for your annual wellness visit    Preventive Care 65 Years and Older, Female Preventive care refers to lifestyle choices and visits with your health care provider that can promote health and wellness. What does preventive care include?  A yearly physical exam. This is also called an annual well check.  Dental exams once or twice a year.  Routine eye exams. Ask your health care provider how often you should have your eyes checked.  Personal lifestyle choices, including:  Daily care of your teeth and gums.  Regular physical activity.  Eating a healthy diet.  Avoiding tobacco and drug use.  Limiting alcohol use.  Practicing safe sex.  Taking low-dose aspirin every day.  Taking vitamin and mineral supplements as recommended by your health care provider. What happens during an annual well check? The services and screenings done by your health care provider during  your annual well check will depend on your age, overall health, lifestyle risk factors, and family history of disease. Counseling  Your health care provider may ask you questions about your:  Alcohol use.  Tobacco use.  Drug use.  Emotional well-being.  Home and relationship well-being.  Sexual activity.  Eating habits.  History of falls.  Memory and ability to understand (cognition).  Work and work Astronomer.  Reproductive health. Screening  You may have the following tests or measurements:  Height, weight, and BMI.  Blood pressure.  Lipid and cholesterol levels. These may be checked every 5 years, or more frequently if you are over 37 years old.  Skin check.  Lung cancer screening. You may have this screening every year starting at age 33 if you have a 30-pack-year history of smoking and currently smoke or have quit within the past 15 years.  Fecal occult blood test (FOBT) of the stool. You may have this test every year starting at age 45.  Flexible sigmoidoscopy or colonoscopy. You may have a sigmoidoscopy every 5 years or a colonoscopy every 10 years starting at age 4.  Hepatitis C blood test.  Hepatitis B blood test.  Sexually transmitted disease (STD) testing.  Diabetes screening. This is done by checking your blood sugar (glucose) after you have not eaten for a while (fasting). You may have this done every 1-3 years.  Bone density scan. This is done to screen for osteoporosis. You may have this done starting at age 78.  Mammogram. This may be done every 1-2 years. Talk to your health care provider about how often you should have regular mammograms. Talk with your health care provider about your test results, treatment options, and if necessary,  the need for more tests. Vaccines  Your health care provider may recommend certain vaccines, such as:  Influenza vaccine. This is recommended every year.  Tetanus, diphtheria, and acellular pertussis (Tdap,  Td) vaccine. You may need a Td booster every 10 years.  Zoster vaccine. You may need this after age 82.  Pneumococcal 13-valent conjugate (PCV13) vaccine. One dose is recommended after age 71.  Pneumococcal polysaccharide (PPSV23) vaccine. One dose is recommended after age 7. Talk to your health care provider about which screenings and vaccines you need and how often you need them. This information is not intended to replace advice given to you by your health care provider. Make sure you discuss any questions you have with your health care provider. Document Released: 03/30/2015 Document Revised: 11/21/2015 Document Reviewed: 01/02/2015 Elsevier Interactive Patient Education  2017 [employer] Prevention in the Home Falls can cause injuries. They can happen to people of all ages. There are many things you can do to make your home safe and to help prevent falls. What can I do on the outside of my home?  Regularly fix the edges of walkways and driveways and fix any cracks.  Remove anything that might make you trip as you walk through a door, such as a raised step or threshold.  Trim any bushes or trees on the path to your home.  Use bright outdoor lighting.  Clear any walking paths of anything that might make someone trip, such as rocks or tools.  Regularly check to see if handrails are loose or broken. Make sure that both sides of any steps have handrails.  Any raised decks and porches should have guardrails on the edges.  Have any leaves, snow, or ice cleared regularly.  Use sand or salt on walking paths during winter.  Clean up any spills in your garage right away. This includes oil or grease spills. What can I do in the bathroom?  Use night lights.  Install grab bars by the toilet and in the tub and shower. Do not use towel bars as grab bars.  Use non-skid mats or decals in the tub or shower.  If you need to sit down in the shower, use a plastic, non-slip  stool.  Keep the floor dry. Clean up any water that spills on the floor as soon as it happens.  Remove soap buildup in the tub or shower regularly.  Attach bath mats securely with double-sided non-slip rug tape.  Do not have throw rugs and other things on the floor that can make you trip. What can I do in the bedroom?  Use night lights.  Make sure that you have a light by your bed that is easy to reach.  Do not use any sheets or blankets that are too big for your bed. They should not hang down onto the floor.  Have a firm chair that has side arms. You can use this for support while you get dressed.  Do not have throw rugs and other things on the floor that can make you trip. What can I do in the kitchen?  Clean up any spills right away.  Avoid walking on wet floors.  Keep items that you use a lot in easy-to-reach places.  If you need to reach something above you, use a strong step stool that has a grab bar.  Keep electrical cords out of the way.  Do not use floor polish or wax that makes floors slippery. If you must use  wax, use non-skid floor wax.  Do not have throw rugs and other things on the floor that can make you trip. What can I do with my stairs?  Do not leave any items on the stairs.  Make sure that there are handrails on both sides of the stairs and use them. Fix handrails that are broken or loose. Make sure that handrails are as long as the stairways.  Check any carpeting to make sure that it is firmly attached to the stairs. Fix any carpet that is loose or worn.  Avoid having throw rugs at the top or bottom of the stairs. If you do have throw rugs, attach them to the floor with carpet tape.  Make sure that you have a light switch at the top of the stairs and the bottom of the stairs. If you do not have them, ask someone to add them for you. What else can I do to help prevent falls?  Wear shoes that:  Do not have high heels.  Have rubber bottoms.  Are  comfortable and fit you well.  Are closed at the toe. Do not wear sandals.  If you use a stepladder:  Make sure that it is fully opened. Do not climb a closed stepladder.  Make sure that both sides of the stepladder are locked into place.  Ask someone to hold it for you, if possible.  Clearly mark and make sure that you can see:  Any grab bars or handrails.  First and last steps.  Where the edge of each step is.  Use tools that help you move around (mobility aids) if they are needed. These include:  Canes.  Walkers.  Scooters.  Crutches.  Turn on the lights when you go into a dark area. Replace any light bulbs as soon as they burn out.  Set up your furniture so you have a clear path. Avoid moving your furniture around.  If any of your floors are uneven, fix them.  If there are any pets around you, be aware of where they are.  Review your medicines with your doctor. Some medicines can make you feel dizzy. This can increase your chance of falling. Ask your doctor what other things that you can do to help prevent falls. This information is not intended to replace advice given to you by your health care provider. Make sure you discuss any questions you have with your health care provider. Document Released: 12/28/2008 Document Revised: 08/09/2015 Document Reviewed: 04/07/2014 Elsevier Interactive Patient Education  2017 [employer].

## 2020-07-18 NOTE — Progress Notes (Signed)
Subjective:   [name] is a 70 y.o. female who presents for Medicare Annual (Subsequent) preventive examination.  Virtual Visit via Telephone Note  I connected with  Vanessa Alvarado on 07/18/20 at  8:00 AM EDT by telephone and verified that I am speaking with the correct person using two identifiers.  Location: Patient: home Provider: [facility] Persons participating in the virtual visit: patient/Nurse Health Advisor   I discussed the limitations, risks, security and privacy concerns of performing an evaluation and management service by telephone and the availability of in person appointments. The patient expressed understanding and agreed to proceed.  Interactive audio and video telecommunications were attempted between this nurse and patient, however failed, due to patient having technical difficulties OR patient did not have access to video capability.  We continued and completed visit with audio only.  Some vital signs may be absent or patient reported.   Vanessa LittlerKasey Addalie Calles, LPN    Review of Systems     Cardiac Risk Factors include: advanced age (>3555men, 71>65 women);hypertension     Objective:    There were no vitals filed for this visit. There is no height or weight on file to calculate BMI.  Advanced Directives 07/18/2020 07/18/2019 07/21/2016 06/01/2015  Does Patient Have a Medical Advance Directive? No No No No  Would patient like information on creating a medical advance directive? No - Patient declined No - Patient declined - -    Current Medications (verified) Outpatient Encounter Medications as of 07/18/2020  Medication Sig  . amLODipine-benazepril (LOTREL) 5-10 MG capsule Take 1 capsule by mouth daily.  . calcium-vitamin D (OSCAL WITH D) 500-200 MG-UNIT tablet Take 1 tablet by mouth daily with breakfast.  . cetirizine (ZYRTEC) 10 MG tablet Take 10 mg by mouth daily.  Marland Kitchen. erythromycin ophthalmic ointment To incisions BID x 14 days  . hydrochlorothiazide (HYDRODIURIL) 12.5  MG tablet Take 1 tablet (12.5 mg total) by mouth daily.   No facility-administered encounter medications on file as of 07/18/2020.    Allergies (verified) Patient has no known allergies.   History: Past Medical History:  Diagnosis Date  . Depression   . Hypertension    Past Surgical History:  Procedure Laterality Date  . herniated disc     lower back  . OVARIAN CYST REMOVAL     Family History  Problem Relation Age of Onset  . Heart disease Father   . Breast cancer Neg Hx    Social History   Socioeconomic History  . Marital status: Married    Spouse name: Not on file  . Number of children: 2  . Years of education: Not on file  . Highest education level: Associate degree: academic program  Occupational History  . Not on file  Tobacco Use  . Smoking status: Former Games developermoker  . Smokeless tobacco: Never Used  Substance and Sexual Activity  . Alcohol use: Yes    Alcohol/week: 0.0 standard drinks  . Drug use: No  . Sexual activity: Yes  Other Topics Concern  . Not on file  Social History Narrative  . Not on file   Social Determinants of Health   Financial Resource Strain: Low Risk   . Difficulty of Paying Living Expenses: Not hard at all  Food Insecurity: No Food Insecurity  . Worried About Programme researcher, broadcasting/film/videounning Out of Food in the Last Year: Never true  . Ran Out of Food in the Last Year: Never true  Transportation Needs: No Transportation Needs  . Lack of Transportation (  Medical): No  . Lack of Transportation (Non-Medical): No  Physical Activity: Inactive  . Days of Exercise per Week: 0 days  . Minutes of Exercise per Session: 0 min  Stress: No Stress Concern Present  . Feeling of Stress : Not at all  Social Connections: Moderately Isolated  . Frequency of Communication with Friends and Family: More than three times a week  . Frequency of Social Gatherings with Friends and Family: More than three times a week  . Attends Religious Services: Never  . Active Member of Clubs or  Organizations: No  . Attends Banker Meetings: Never  . Marital Status: Married    Tobacco Counseling Counseling given: Not Answered   Clinical Intake:  Pre-visit preparation completed: Yes  Pain : No/denies pain     Nutritional Status: BMI > 30  Obese Nutritional Risks: None Diabetes: No  How often do you need to have someone help you when you read instructions, pamphlets, or other written materials from your doctor or pharmacy?: 1 - Never    Interpreter Needed?: No  Information entered by :: Vanessa Littler LPN   Activities of Daily Living In your present state of health, do you have any difficulty performing the following activities: 07/18/2020  Hearing? N  Vision? N  Difficulty concentrating or making decisions? N  Walking or climbing stairs? N  Dressing or bathing? N  Doing errands, shopping? N  Preparing Food and eating ? N  Using the Toilet? N  In the past six months, have you accidently leaked urine? N  Do you have problems with loss of bowel control? N  Managing your Medications? N  Managing your Finances? N  Housekeeping or managing your Housekeeping? N  Some recent data might be hidden    Patient Care Team: Duanne Limerick, MD as PCP - General (Family Medicine)  Indicate any recent Medical Services you may have received from other than [facility] providers in the past year (date may be approximate).     Assessment:   This is a routine wellness examination for Vanessa Alvarado.  Hearing/Vision screen  Hearing Screening   125Hz  250Hz  500Hz  1000Hz  2000Hz  3000Hz  4000Hz  6000Hz  8000Hz   Right ear:           Left ear:           Comments: Pt denies hearing difficulty  Vision Screening Comments: Annual vision screenings done at [address]  Dietary issues and exercise activities discussed: Current Exercise Habits: The patient does not participate in regular exercise at present, Exercise limited by: None identified  Goals Addressed             This Visit's Progress   . Increase physical activity       Recommend increasing physical activity to at least 3 days per week       Depression Screen PHQ 2/9 Scores 07/18/2020 04/16/2020 10/12/2019 07/18/2019 04/14/2019 09/03/2018 12/01/2017  PHQ - 2 Score 0 0 0 0 0 0 0  PHQ- 9 Score - 0 1 - 0 0 -    Fall Risk Fall Risk  07/18/2020 04/16/2020 10/12/2019 07/18/2019 04/14/2019  Falls in the past year? 0 0 0 0 0  Number falls in past yr: 0 - - 0 -  Injury with Fall? 0 - - 0 -  Risk for fall due to : No Fall Risks - - No Fall Risks -  Follow up Falls prevention discussed - Falls evaluation completed Falls prevention discussed Falls evaluation completed  FALL RISK PREVENTION PERTAINING TO THE HOME:  Any stairs in or around the home? Yes  If so, are there any without handrails? No  Home free of loose throw rugs in walkways, pet beds, electrical cords, etc? Yes  Adequate lighting in your home to reduce risk of falls? Yes   ASSISTIVE DEVICES UTILIZED TO PREVENT FALLS:  Life alert? No  Use of a cane, walker or w/c? No  Grab bars in the bathroom? Yes  Shower chair or bench in shower? Yes  Elevated toilet seat or a handicapped toilet? Yes   TIMED UP AND GO:  Was the test performed? No . Telephonic visit.   Cognitive Function: Normal cognitive status assessed by direct observation by this Nurse Health Advisor. No abnormalities found.          Immunizations Immunization History  Administered Date(s) Administered  . Fluad Quad(high Dose 65+) 01/12/2020  . Influenza Split 12/26/2010  . Influenza, High Dose Seasonal PF 12/01/2017  . Influenza-Unspecified 11/23/2014, 12/18/2016, 12/19/2016, 02/01/2019  . Moderna Sars-Covid-2 Vaccination 06/20/2019, 07/18/2019, 03/01/2020  . Pneumococcal Conjugate-13 12/01/2017  . Pneumococcal Polysaccharide-23 01/10/2011, 04/14/2019  . Tdap 01/21/2016    TDAP status: Up to date  Flu Vaccine status: Up to date  Pneumococcal vaccine status: Up to  date  Covid-19 vaccine status: Completed vaccines  Qualifies for Shingles Vaccine? Yes   Zostavax completed No   Shingrix Completed?: No.    Education has been provided regarding the importance of this vaccine. Patient has been advised to call insurance company to determine out of pocket expense if they have not yet received this vaccine. Advised may also receive vaccine at local pharmacy or Health Dept. Verbalized acceptance and understanding.  Screening Tests Health Maintenance  Topic Date Due  . COLONOSCOPY (Pts 45-55yrs Insurance coverage will need to be confirmed)  Never done  . Hepatitis C Screening  04/16/2021 (Originally [birth year])  . COVID-19 Vaccine (4 - Booster for Moderna series) 08/30/2020  . INFLUENZA VACCINE  10/15/2020  . COLON CANCER SCREENING ANNUAL FOBT  11/02/2020  . MAMMOGRAM  07/12/2022  . TETANUS/TDAP  01/20/2026  . DEXA SCAN  Completed  . PNA vac Low Risk Adult  Completed  . HPV VACCINES  Aged Out    Health Maintenance  Health Maintenance Due  Topic Date Due  . COLONOSCOPY (Pts 45-23yrs Insurance coverage will need to be confirmed)  Never done    Colorectal cancer screening: Type of screening: FOBT/FIT. Completed 11/03/19. Repeat every 1 years  Mammogram status: Completed 07/11/20. Repeat every year  Bone Density status: Completed 01/20/19. Results reflect: Bone density results: OSTEOPENIA. Repeat every 2 years.  Lung Cancer Screening: (Low Dose CT Chest recommended if Age 54-80 years, 30 pack-year currently smoking OR have quit w/in 15years.) does not qualify.   Additional Screening:  Hepatitis C Screening: does qualify; postponed  Vision Screening: Recommended annual ophthalmology exams for early detection of glaucoma and other disorders of the eye. Is the patient up to date with their annual eye exam?  Yes  Who is the provider or what is the name of the office in which the patient attends annual eye exams? Dr. Clearance Coots.   Dental Screening:  Recommended annual dental exams for proper oral hygiene  Community Resource Referral / Chronic Care Management: CRR required this visit?  No   CCM required this visit?  No      Plan:     I have personally reviewed and noted the following in the patient's chart:   .  Medical and social history . Use of alcohol, tobacco or illicit drugs  . Current medications and supplements including opioid prescriptions.  . Functional ability and status . Nutritional status . Physical activity . Advanced directives . List of other physicians . Hospitalizations, surgeries, and ER visits in previous 12 months . Vitals . Screenings to include cognitive, depression, and falls . Referrals and appointments  In addition, I have reviewed and discussed with patient certain preventive protocols, quality metrics, and best practice recommendations. A written personalized care plan for preventive services as well as general preventive health recommendations were provided to patient.     Vanessa Littler, LPN   05/22/3426   Nurse Notes: none

## 2020-10-16 ENCOUNTER — Other Ambulatory Visit: Payer: Self-pay

## 2020-10-16 ENCOUNTER — Ambulatory Visit: Payer: Medicare PPO | Admitting: Family Medicine

## 2020-10-16 ENCOUNTER — Encounter: Payer: Self-pay | Admitting: Family Medicine

## 2020-10-16 VITALS — BP 124/82 | HR 74 | Ht 64.0 in | Wt 208.0 lb

## 2020-10-16 DIAGNOSIS — I10 Essential (primary) hypertension: Secondary | ICD-10-CM

## 2020-10-16 DIAGNOSIS — E782 Mixed hyperlipidemia: Secondary | ICD-10-CM | POA: Diagnosis not present

## 2020-10-16 DIAGNOSIS — L0100 Impetigo, unspecified: Secondary | ICD-10-CM | POA: Diagnosis not present

## 2020-10-16 DIAGNOSIS — R739 Hyperglycemia, unspecified: Secondary | ICD-10-CM

## 2020-10-16 MED ORDER — HYDROCHLOROTHIAZIDE 12.5 MG PO TABS: 12.5000 mg | ORAL_TABLET | Freq: Every day | ORAL | 1 refills | Status: DC

## 2020-10-16 MED ORDER — AMLODIPINE BESY-BENAZEPRIL HCL 5-10 MG PO CAPS: 1.0000 | ORAL_CAPSULE | Freq: Every day | ORAL | 1 refills | Status: DC

## 2020-10-16 MED ORDER — MUPIROCIN 2 % EX OINT: 1.0000 "application " | TOPICAL_OINTMENT | Freq: Two times a day (BID) | CUTANEOUS | 0 refills | Status: DC

## 2020-10-16 NOTE — Progress Notes (Signed)
Date:  10/16/2020   Name:  Vanessa Alvarado   DOB:  [birth year]   MRN:  [id number]   Chief Complaint: No chief complaint on file.  Hypertension This is a chronic problem. The current episode started more than 1 year ago. The problem has been waxing and waning since onset. The problem is uncontrolled. Pertinent negatives include no anxiety, blurred vision, chest pain, headaches, malaise/fatigue, neck pain, orthopnea, palpitations, peripheral edema, PND, shortness of breath or sweats. There are no associated agents to hypertension. Risk factors for coronary artery disease include obesity. Past treatments include ACE inhibitors, calcium channel blockers and diuretics. The current treatment provides moderate improvement. There are no compliance problems.  There is no history of angina, kidney disease, CAD/MI, CVA, heart failure, left ventricular hypertrophy, PVD or retinopathy. There is no history of chronic renal disease, a hypertension causing med or renovascular disease.   Lab Results  Component Value Date   CREATININE 0.93 04/16/2020   BUN 19 04/16/2020   NA 141 04/16/2020   K 4.0 04/16/2020   CL 100 04/16/2020   CO2 24 04/16/2020   Lab Results  Component Value Date   CHOL 206 (H) 04/16/2020   HDL 53 04/16/2020   LDLCALC 119 (H) 04/16/2020   TRIG 195 (H) 04/16/2020   CHOLHDL 3.9 09/03/2018   No results found for: TSH No results found for: HGBA1C No results found for: WBC, HGB, HCT, MCV, PLT No results found for: ALT, AST, GGT, ALKPHOS, BILITOT   Review of Systems  Constitutional:  Negative for chills, fever and malaise/fatigue.  HENT:  Negative for drooling, ear discharge, ear pain and sore throat.   Eyes:  Negative for blurred vision.  Respiratory:  Negative for cough, shortness of breath and wheezing.   Cardiovascular:  Negative for chest pain, palpitations, orthopnea, leg swelling and PND.  Gastrointestinal:  Negative for abdominal pain, blood in stool, constipation,  diarrhea and nausea.  Endocrine: Negative for polydipsia.  Genitourinary:  Negative for dysuria, frequency, hematuria and urgency.  Musculoskeletal:  Negative for back pain, myalgias and neck pain.  Skin:  Negative for rash.  Allergic/Immunologic: Negative for environmental allergies.  Neurological:  Negative for dizziness and headaches.  Hematological:  Does not bruise/bleed easily.  Psychiatric/Behavioral:  Negative for suicidal ideas. The patient is not nervous/anxious.    Patient Active Problem List   Diagnosis Date Noted   Mixed hyperlipidemia 04/14/2019   Psoriasis 08/28/2014   Allergic rhinitis, seasonal 08/28/2014   BP (high blood pressure) 08/25/2014   Essential (primary) hypertension 09/05/2013   Pleurisy 09/05/2013    No Known Allergies  Past Surgical History:  Procedure Laterality Date   herniated disc     lower back   OVARIAN CYST REMOVAL      Social History   Tobacco Use   Smoking status: Former   Smokeless tobacco: Never  Substance Use Topics   Alcohol use: Yes    Alcohol/week: 0.0 standard drinks   Drug use: No     Medication list has been reviewed and updated.  Current Meds  Medication Sig   amLODipine-benazepril (LOTREL) 5-10 MG capsule Take 1 capsule by mouth daily.   calcium-vitamin D (OSCAL WITH D) 500-200 MG-UNIT tablet Take 1 tablet by mouth daily with breakfast.   cetirizine (ZYRTEC) 10 MG tablet Take 10 mg by mouth daily.   hydrochlorothiazide (HYDRODIURIL) 12.5 MG tablet Take 1 tablet (12.5 mg total) by mouth daily.    PHQ 2/9 Scores 10/16/2020 07/18/2020 04/16/2020 10/12/2019  PHQ - 2 Score 0 0 0 0  PHQ- 9 Score 1 - 0 1    GAD 7 : Generalized Anxiety Score 10/16/2020 04/16/2020 10/12/2019 04/14/2019  Nervous, Anxious, on Edge 0 0 0 0  Control/stop worrying 0 0 0 0  Worry too much - different things 0 0 0 0  Trouble relaxing 0 0 0 0  Restless 0 0 0 0  Easily annoyed or irritable 0 0 0 0  Afraid - awful might happen 0 0 0 0  Total GAD 7  Score 0 0 0 0    BP Readings from Last 3 Encounters:  10/16/20 (!) 124/100  04/16/20 112/80  10/12/19 120/80    Physical Exam Vitals and nursing note reviewed.  Constitutional:      General: She is not in acute distress.    Appearance: She is well-developed. She is not diaphoretic.  HENT:     Head: Normocephalic and atraumatic.     Right Ear: Tympanic membrane, ear canal and external ear normal. There is no impacted cerumen.     Left Ear: Tympanic membrane, ear canal and external ear normal. There is no impacted cerumen.     Nose: Nose normal.  Eyes:     General: Lids are everted, no foreign bodies appreciated. No scleral icterus.       Right eye: No discharge.        Left eye: No foreign body, discharge or hordeolum.     Conjunctiva/sclera: Conjunctivae normal.     Right eye: Right conjunctiva is not injected.     Left eye: Left conjunctiva is not injected.     Pupils: Pupils are equal, round, and reactive to light.  Neck:     Thyroid: No thyroid mass, thyromegaly or thyroid tenderness.     Vascular: No JVD.     Trachea: No tracheal deviation.  Cardiovascular:     Rate and Rhythm: Normal rate and regular rhythm.     Heart sounds: Normal heart sounds, S1 normal and S2 normal. No murmur heard. No systolic murmur is present.  No diastolic murmur is present.    No friction rub. No gallop. No S3 or S4 sounds.  Pulmonary:     Effort: Pulmonary effort is normal. No respiratory distress.     Breath sounds: Normal breath sounds. No wheezing or rales.  Abdominal:     General: Bowel sounds are normal.     Palpations: Abdomen is soft. There is no mass.     Tenderness: There is no abdominal tenderness. There is no guarding or rebound.  Musculoskeletal:        General: No tenderness. Normal range of motion.     Cervical back: Normal range of motion and neck supple.     Right lower leg: No edema.     Left lower leg: No edema.  Lymphadenopathy:     Cervical: No cervical adenopathy.   Skin:    General: Skin is warm and dry.     Findings: No rash.     Comments: Honey crust area left ear lobe  Neurological:     Mental Status: She is alert and oriented to person, place, and time.     Cranial Nerves: No cranial nerve deficit.     Deep Tendon Reflexes: Reflexes are normal and symmetric. Reflexes normal.  Psychiatric:        Mood and Affect: Mood is not anxious or depressed.    Wt Readings from Last 3 Encounters:  10/16/20 208  lb (94.3 kg)  04/16/20 210 lb (95.3 kg)  10/12/19 (!) 205 lb (93 kg)    BP (!) 124/100   Pulse 74   Ht 5\' 4"  (1.626 m)   Wt 208 lb (94.3 kg)   BMI 35.70 kg/m   Assessment and Plan:  1. Essential (primary) hypertension Chronic.  Controlled.  Stable.  Blood pressure today is 124/82.  Continue amlodipine benazepril 5-10 mg once a day as well as hydrochlorothiazide 12.5 mg once a day. - amLODipine-benazepril (LOTREL) 5-10 MG capsule; Take 1 capsule by mouth daily.  Dispense: 90 capsule; Refill: 1 - hydrochlorothiazide (HYDRODIURIL) 12.5 MG tablet; Take 1 tablet (12.5 mg total) by mouth daily.  Dispense: 90 tablet; Refill: 1  2. Mixed hyperlipidemia Chronic.  Controlled.  Stable.  Controlled with diet but we will check lipid panel for current level of control. - Lipid Panel With LDL/HDL Ratio  3. Hyperglycemia Review of patient's previous renal function panels have noted there has been a steady increase in the glucose and we will check an A1c to see if there is the possibility of prediabetes. - HgB A1c  4. Impetigo New onset.  Persistent.  Patient is noted over several weeks in area of crusting of her left earlobe.  This is consistent with impetigo and patient's been instructed to use a washcloth to take the crusting off and to apply Bactroban twice a day and if unresolved to contact for evaluation perhaps by dermatology. - mupirocin ointment (BACTROBAN) 2 %; Apply 1 application topically 2 (two) times daily.  Dispense: 22 g; Refill: 0

## 2020-10-17 LAB — LIPID PANEL WITH LDL/HDL RATIO
Cholesterol, Total: 216 mg/dL — ABNORMAL HIGH (ref 100–199)
HDL: 48 mg/dL (ref >39)
LDL Chol Calc (NIH): 130 mg/dL — ABNORMAL HIGH (ref 0–99)
LDL/HDL Ratio: 2.7 ratio (ref 0.0–3.2)
Triglycerides: 217 mg/dL — ABNORMAL HIGH (ref 0–149)
VLDL Cholesterol Cal: 38 mg/dL (ref 5–40)

## 2020-10-17 LAB — HEMOGLOBIN A1C
Est. average glucose Bld gHb Est-mCnc: 128 mg/dL
Hgb A1c MFr Bld: 6.1 % — ABNORMAL HIGH (ref 4.8–5.6)

## 2020-10-19 DIAGNOSIS — E785 Hyperlipidemia, unspecified: Secondary | ICD-10-CM | POA: Diagnosis not present

## 2020-10-19 DIAGNOSIS — Z87891 Personal history of nicotine dependence: Secondary | ICD-10-CM | POA: Diagnosis not present

## 2020-10-19 DIAGNOSIS — Z8249 Family history of ischemic heart disease and other diseases of the circulatory system: Secondary | ICD-10-CM | POA: Diagnosis not present

## 2020-10-19 DIAGNOSIS — J309 Allergic rhinitis, unspecified: Secondary | ICD-10-CM | POA: Diagnosis not present

## 2020-10-19 DIAGNOSIS — I10 Essential (primary) hypertension: Secondary | ICD-10-CM | POA: Diagnosis not present

## 2020-10-19 DIAGNOSIS — Z6837 Body mass index (BMI) 37.0-37.9, adult: Secondary | ICD-10-CM | POA: Diagnosis not present

## 2020-10-19 DIAGNOSIS — M8580 Other specified disorders of bone density and structure, unspecified site: Secondary | ICD-10-CM | POA: Diagnosis not present

## 2020-10-19 DIAGNOSIS — L0100 Impetigo, unspecified: Secondary | ICD-10-CM | POA: Diagnosis not present

## 2020-11-30 ENCOUNTER — Telehealth: Payer: Self-pay

## 2020-11-30 NOTE — Telephone Encounter (Signed)
Called pt- she will do the FIT when comes in October

## 2020-12-17 ENCOUNTER — Other Ambulatory Visit: Payer: Self-pay

## 2020-12-17 ENCOUNTER — Ambulatory Visit (INDEPENDENT_AMBULATORY_CARE_PROVIDER_SITE_OTHER): Payer: Medicare PPO

## 2020-12-17 DIAGNOSIS — R739 Hyperglycemia, unspecified: Secondary | ICD-10-CM | POA: Insufficient documentation

## 2020-12-17 DIAGNOSIS — Z1211 Encounter for screening for malignant neoplasm of colon: Secondary | ICD-10-CM

## 2020-12-17 DIAGNOSIS — Z23 Encounter for immunization: Secondary | ICD-10-CM | POA: Diagnosis not present

## 2020-12-17 NOTE — Progress Notes (Signed)
fe 

## 2020-12-18 LAB — HEMOGLOBIN A1C
Est. average glucose Bld gHb Est-mCnc: 128 mg/dL
Hgb A1c MFr Bld: 6.1 % — ABNORMAL HIGH (ref 4.8–5.6)

## 2020-12-18 NOTE — Progress Notes (Signed)
Called pt left VM to call back. ? ?[facility] nurse may give results to patient if they return call to clinic, a CRM has been created. ? ?KP

## 2020-12-25 DIAGNOSIS — Z1211 Encounter for screening for malignant neoplasm of colon: Secondary | ICD-10-CM | POA: Diagnosis not present

## 2020-12-28 LAB — FECAL OCCULT BLOOD, IMMUNOCHEMICAL: IFOBT: NEGATIVE

## 2020-12-29 LAB — FECAL OCCULT BLOOD, IMMUNOCHEMICAL: Fecal Occult Bld: NEGATIVE

## 2021-03-04 IMAGING — MG DIGITAL SCREENING BILAT W/ TOMO W/ CAD
8 series · 8 of 24 positions shown · non-contrast
Comparison: Previous exam(s).

CLINICAL DATA: Screening.

EXAM:
DIGITAL SCREENING BILATERAL MAMMOGRAM WITH TOMO AND CAD

[L CC synth-2D]
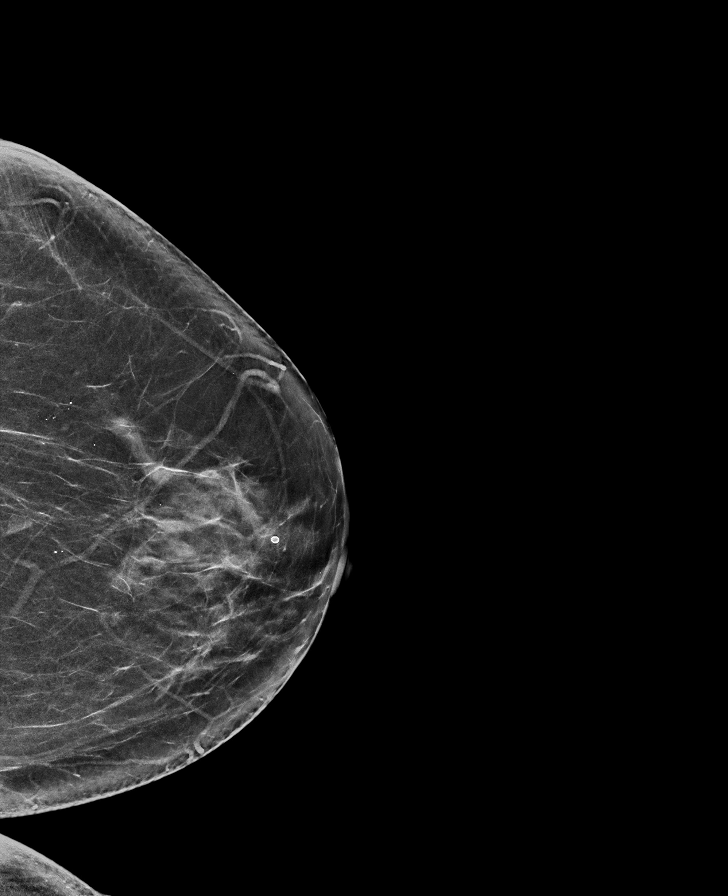

[R CC synth-2D]
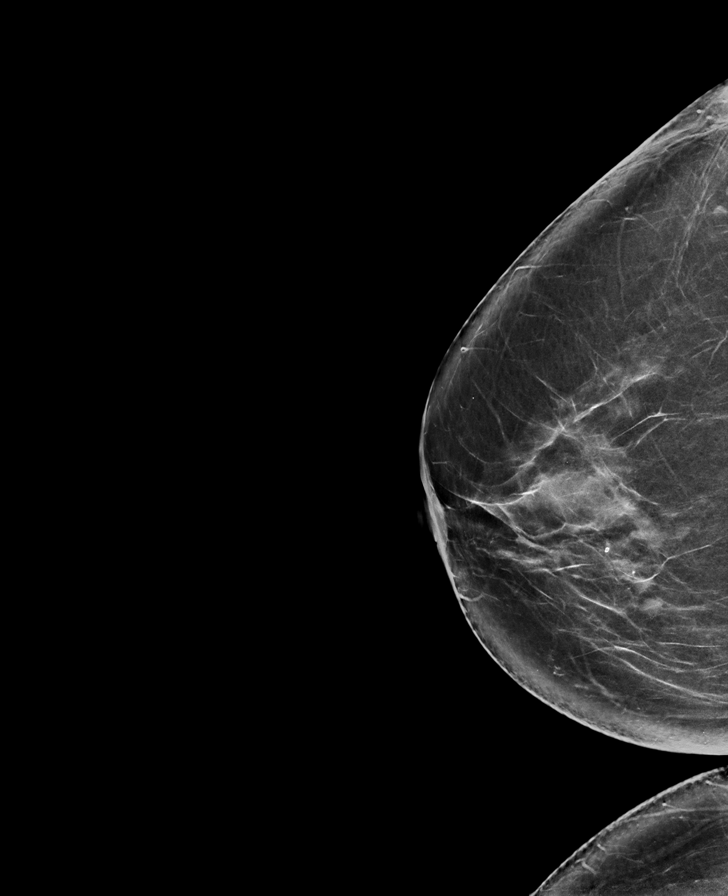

[L MLO synth-2D]
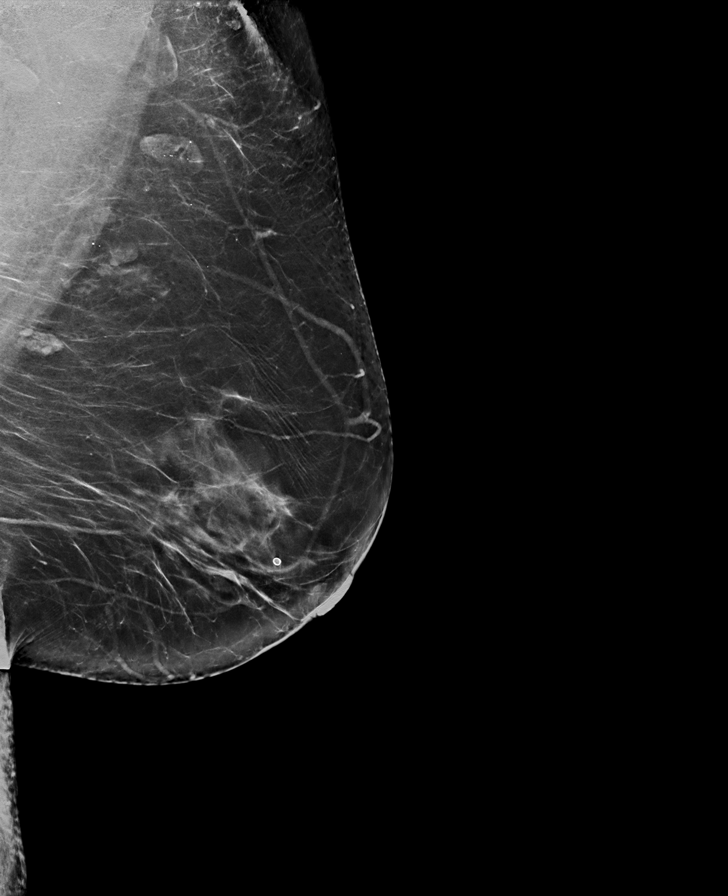

[R MLO synth-2D]
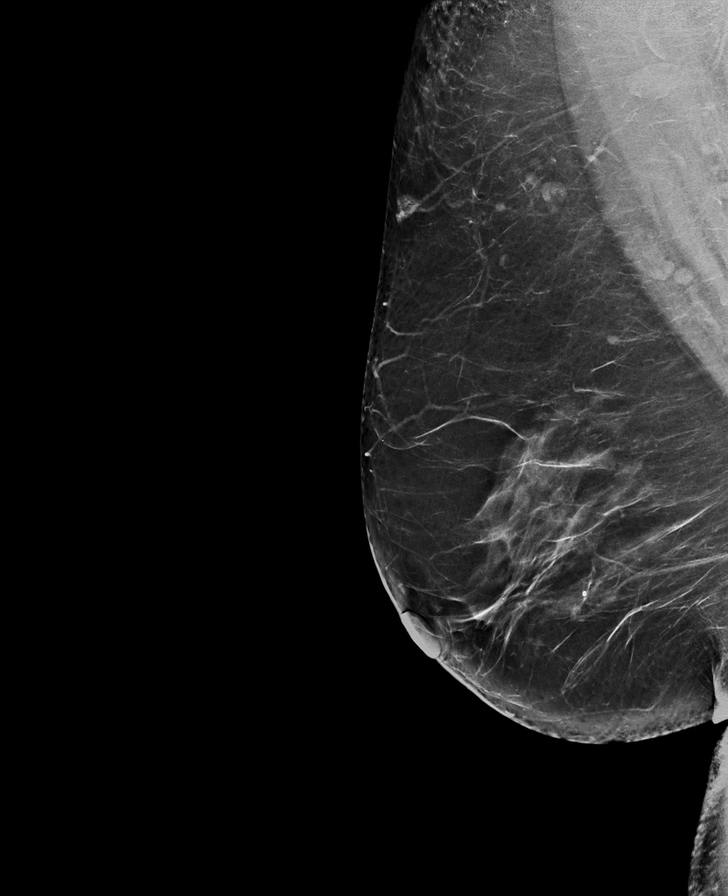

[L CC tomo · tomo slice 35/70.0]
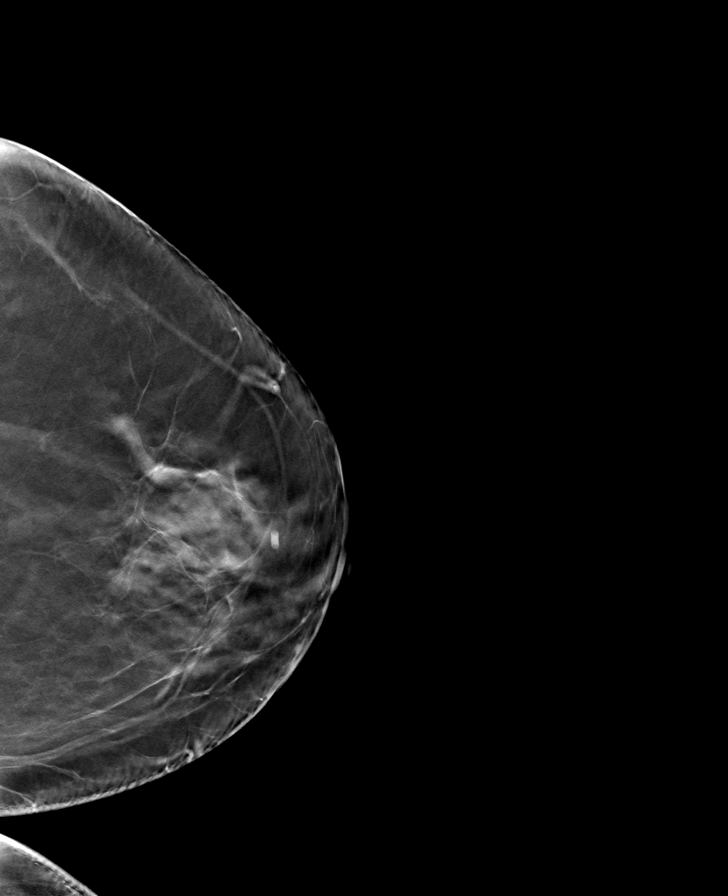

[L MLO tomo · tomo slice 41/82.0]
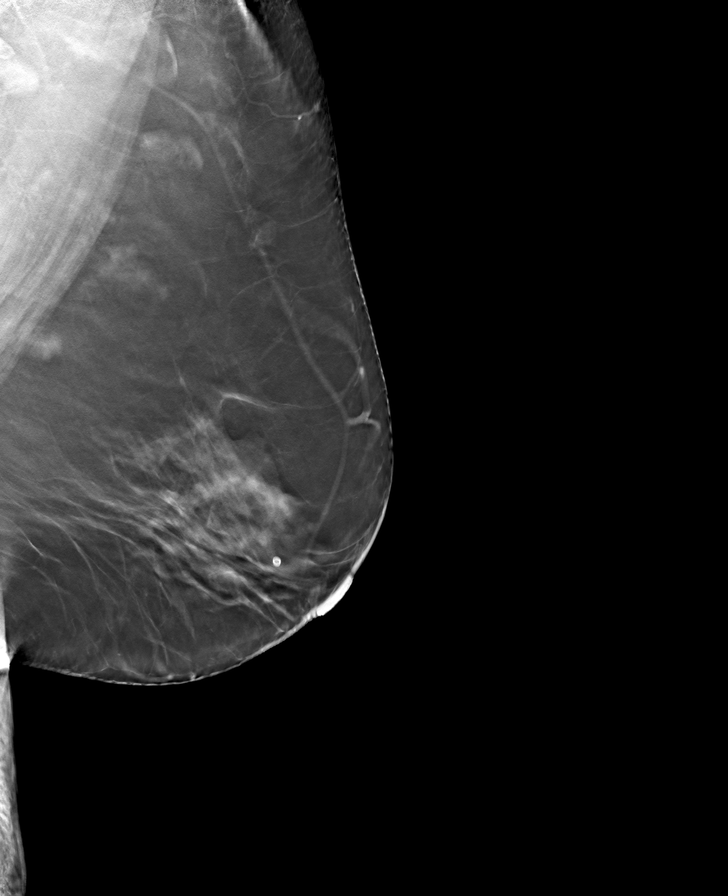

[R MLO tomo · tomo slice 41/81.0]
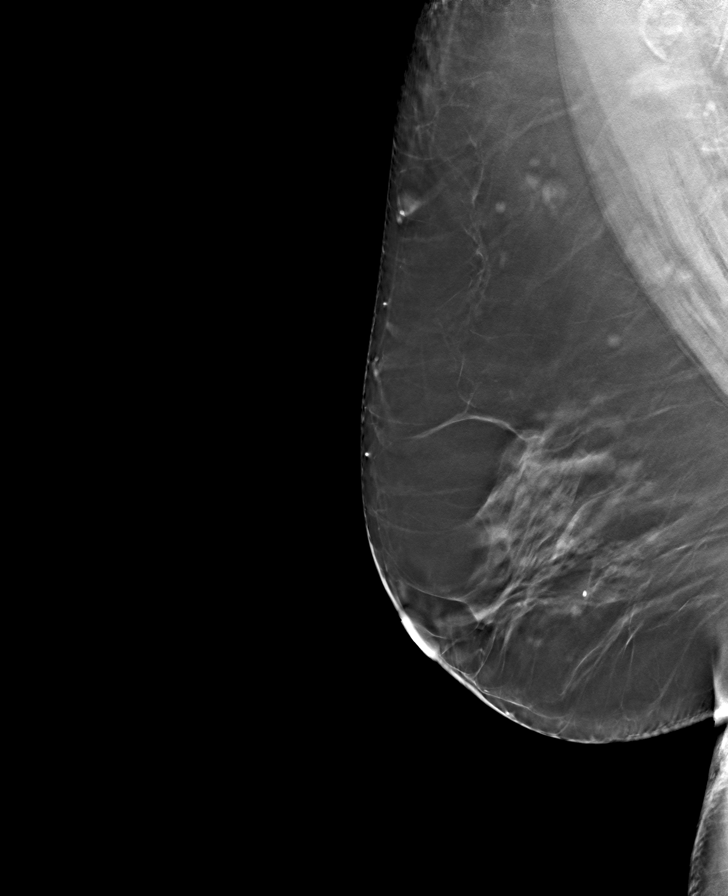

[R CC tomo · tomo slice 37/74.0]
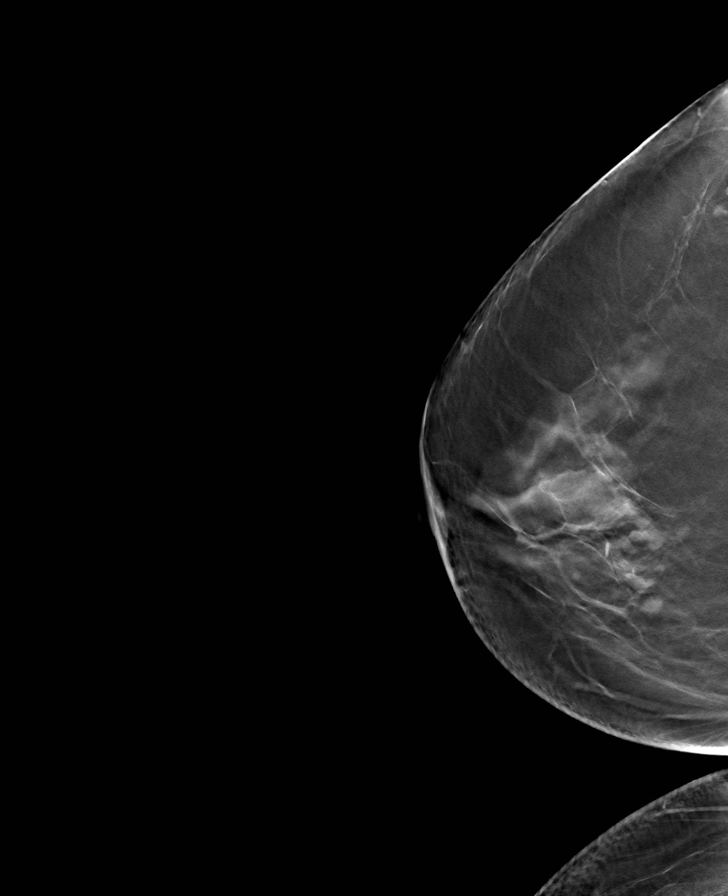

[8 of 24 positions shown; findings below may reference images not displayed]

ACR Breast Density Category b: There are scattered areas of
fibroglandular density.
FINDINGS: There are no findings suspicious for malignancy. Images were
processed with CAD.
IMPRESSION: No mammographic evidence of malignancy. A result letter of this
screening mammogram will be mailed directly to the patient.

RECOMMENDATION:
Screening mammogram in one year. (Code:[id number])

BI-RADS CATEGORY  1: Negative.

## 2021-04-12 ENCOUNTER — Other Ambulatory Visit: Payer: Self-pay | Admitting: Family Medicine

## 2021-04-12 DIAGNOSIS — I10 Essential (primary) hypertension: Secondary | ICD-10-CM

## 2021-04-12 NOTE — Telephone Encounter (Signed)
Requested Prescriptions  Pending Prescriptions Disp Refills   amLODipine-benazepril (LOTREL) 5-10 MG capsule [Pharmacy Med Name: amLODIPine Besy-Benazepril HCl 5-10 MG Oral Capsule] 90 capsule 0    Sig: Take 1 capsule by mouth once daily     Cardiovascular: CCB + ACEI Combos Failed - 04/12/2021  2:30 AM      Failed - Cr in normal range and within 180 days    Creatinine, Ser  Date Value Ref Range Status  04/16/2020 0.93 0.57 - 1.00 mg/dL Final         Failed - K in normal range and within 180 days    Potassium  Date Value Ref Range Status  04/16/2020 4.0 3.5 - 5.2 mmol/L Final         Passed - Patient is not pregnant      Passed - Last BP in normal range    BP Readings from Last 1 Encounters:  10/16/20 124/82         Passed - Valid encounter within last 6 months    Recent Outpatient Visits          5 months ago Essential (primary) hypertension   [facility] Duanne Limerick, MD   12 months ago Essential (primary) hypertension   [facility] Duanne Limerick, MD   1 year ago Essential (primary) hypertension   [facility] Duanne Limerick, MD   1 year ago Essential (primary) hypertension   [facility] Duanne Limerick, MD   2 years ago Essential (primary) hypertension   [facility] Duanne Limerick, MD      Future Appointments            In 6 days Duanne Limerick, MD [facility]            hydrochlorothiazide (HYDRODIURIL) 12.5 MG tablet [Pharmacy Med Name: hydroCHLOROthiazide 12.5 MG Oral Tablet] 90 tablet 0    Sig: Take 1 tablet by mouth once daily     Cardiovascular: Diuretics - Thiazide Failed - 04/12/2021  2:30 AM      Failed - Ca in normal range and within 360 days    Calcium  Date Value Ref Range Status  04/16/2020 10.2 8.7 - 10.3 mg/dL Final         Failed - Cr in normal range and within 360 days    Creatinine, Ser  Date Value Ref Range Status  04/16/2020 0.93 0.57 - 1.00 mg/dL Final          Failed - K in normal range and within 360 days    Potassium  Date Value Ref Range Status  04/16/2020 4.0 3.5 - 5.2 mmol/L Final         Failed - Na in normal range and within 360 days    Sodium  Date Value Ref Range Status  04/16/2020 141 134 - 144 mmol/L Final         Passed - Last BP in normal range    BP Readings from Last 1 Encounters:  10/16/20 124/82         Passed - Valid encounter within last 6 months    Recent Outpatient Visits          5 months ago Essential (primary) hypertension   [facility] Duanne Limerick, MD   12 months ago Essential (primary) hypertension   [facility] Duanne Limerick, MD   1 year ago Essential (primary) hypertension   [facility] Duanne Limerick, MD   1 year ago Essential (primary) hypertension   [facility] Duanne Limerick, MD   2 years ago Essential (primary) hypertension   [facility] Duanne Limerick, MD      Future Appointments            In 6 days Duanne Limerick, MD [facility], [facility]

## 2021-04-18 ENCOUNTER — Other Ambulatory Visit: Payer: Self-pay

## 2021-04-18 ENCOUNTER — Ambulatory Visit: Payer: Medicare PPO | Admitting: Family Medicine

## 2021-04-18 ENCOUNTER — Encounter: Payer: Self-pay | Admitting: Family Medicine

## 2021-04-18 VITALS — BP 128/80 | HR 80 | Ht 64.0 in | Wt 194.0 lb

## 2021-04-18 DIAGNOSIS — I10 Essential (primary) hypertension: Secondary | ICD-10-CM

## 2021-04-18 MED ORDER — HYDROCHLOROTHIAZIDE 12.5 MG PO TABS: 12.5000 mg | ORAL_TABLET | Freq: Every day | ORAL | 1 refills | Status: DC

## 2021-04-18 MED ORDER — AMLODIPINE BESY-BENAZEPRIL HCL 5-10 MG PO CAPS: 1.0000 | ORAL_CAPSULE | Freq: Every day | ORAL | 1 refills | Status: DC

## 2021-04-18 NOTE — Progress Notes (Signed)
Date:  04/18/2021   Name:  Vanessa Alvarado   DOB:  [birth year]   MRN:  [id number]   Chief Complaint: Hypertension  Hypertension This is a chronic problem. The current episode started more than 1 year ago. The problem has been gradually improving since onset. The problem is controlled. Pertinent negatives include no anxiety, blurred vision, chest pain, headaches, malaise/fatigue, neck pain, orthopnea, palpitations, peripheral edema, PND, shortness of breath or sweats. There are no associated agents to hypertension. Risk factors for coronary artery disease include dyslipidemia. Past treatments include diuretics, calcium channel blockers and ACE inhibitors. The current treatment provides moderate improvement. There are no compliance problems.  There is no history of angina, kidney disease, CAD/MI, CVA, heart failure, left ventricular hypertrophy, PVD or retinopathy. There is no history of chronic renal disease, a hypertension causing med or renovascular disease.   Lab Results  Component Value Date   NA 141 04/16/2020   K 4.0 04/16/2020   CO2 24 04/16/2020   GLUCOSE 128 (H) 04/16/2020   BUN 19 04/16/2020   CREATININE 0.93 04/16/2020   CALCIUM 10.2 04/16/2020   GFRNONAA 64 04/16/2020   Lab Results  Component Value Date   CHOL 216 (H) 10/16/2020   HDL 48 10/16/2020   LDLCALC 130 (H) 10/16/2020   TRIG 217 (H) 10/16/2020   CHOLHDL 3.9 09/03/2018   No results found for: TSH Lab Results  Component Value Date   HGBA1C 6.1 (H) 12/17/2020   No results found for: WBC, HGB, HCT, MCV, PLT No results found for: ALT, AST, GGT, ALKPHOS, BILITOT No results found for: 25OHVITD2, 25OHVITD3, VD25OH   Review of Systems  Constitutional:  Negative for chills, fever and malaise/fatigue.  HENT:  Negative for drooling, ear discharge, ear pain and sore throat.   Eyes:  Negative for blurred vision.  Respiratory:  Negative for cough, shortness of breath and wheezing.   Cardiovascular:  Negative for  chest pain, palpitations, orthopnea, leg swelling and PND.  Gastrointestinal:  Negative for abdominal pain, blood in stool, constipation, diarrhea and nausea.  Endocrine: Negative for polydipsia.  Genitourinary:  Negative for dysuria, frequency, hematuria and urgency.  Musculoskeletal:  Negative for back pain, myalgias and neck pain.  Skin:  Negative for rash.  Allergic/Immunologic: Negative for environmental allergies.  Neurological:  Negative for dizziness and headaches.  Hematological:  Does not bruise/bleed easily.  Psychiatric/Behavioral:  Negative for suicidal ideas. The patient is not nervous/anxious.    Patient Active Problem List   Diagnosis Date Noted   Hyperglycemia 12/17/2020   Mixed hyperlipidemia 04/14/2019   Psoriasis 08/28/2014   Allergic rhinitis, seasonal 08/28/2014   BP (high blood pressure) 08/25/2014   Essential (primary) hypertension 09/05/2013   Pleurisy 09/05/2013    No Known Allergies  Past Surgical History:  Procedure Laterality Date   herniated disc     lower back   OVARIAN CYST REMOVAL      Social History   Tobacco Use   Smoking status: Former   Smokeless tobacco: Never  Substance Use Topics   Alcohol use: Yes    Alcohol/week: 0.0 standard drinks   Drug use: No     Medication list has been reviewed and updated.  Current Meds  Medication Sig   amLODipine-benazepril (LOTREL) 5-10 MG capsule Take 1 capsule by mouth once daily   calcium-vitamin D (OSCAL WITH D) 500-200 MG-UNIT tablet Take 1 tablet by mouth daily with breakfast.   cetirizine (ZYRTEC) 10 MG tablet Take 10 mg by mouth daily.  hydrochlorothiazide (HYDRODIURIL) 12.5 MG tablet Take 1 tablet by mouth once daily   mupirocin ointment (BACTROBAN) 2 % Apply 1 application topically 2 (two) times daily.    PHQ 2/9 Scores 04/18/2021 10/16/2020 07/18/2020 04/16/2020  PHQ - 2 Score 0 0 0 0  PHQ- 9 Score 0 1 - 0    GAD 7 : Generalized Anxiety Score 04/18/2021 10/16/2020 04/16/2020 10/12/2019   Nervous, Anxious, on Edge 0 0 0 0  Control/stop worrying 0 0 0 0  Worry too much - different things 0 0 0 0  Trouble relaxing 0 0 0 0  Restless 0 0 0 0  Easily annoyed or irritable 0 0 0 0  Afraid - awful might happen 0 0 0 0  Total GAD 7 Score 0 0 0 0  Anxiety Difficulty Not difficult at all - - -    BP Readings from Last 3 Encounters:  04/18/21 (!) 144/88  10/16/20 124/82  04/16/20 112/80    Physical Exam Vitals and nursing note reviewed.  Constitutional:      Appearance: She is well-developed.  HENT:     Head: Normocephalic.     Right Ear: Tympanic membrane, ear canal and external ear normal. There is no impacted cerumen.     Left Ear: Tympanic membrane, ear canal and external ear normal. There is no impacted cerumen.     Nose: Nose normal. No congestion or rhinorrhea.  Eyes:     General: Lids are everted, no foreign bodies appreciated. No scleral icterus.       Left eye: No foreign body or hordeolum.     Conjunctiva/sclera: Conjunctivae normal.     Right eye: Right conjunctiva is not injected.     Left eye: Left conjunctiva is not injected.     Pupils: Pupils are equal, round, and reactive to light.  Neck:     Thyroid: No thyromegaly.     Vascular: No JVD.     Trachea: No tracheal deviation.  Cardiovascular:     Rate and Rhythm: Normal rate and regular rhythm.     Heart sounds: Normal heart sounds. No murmur heard.   No friction rub. No gallop.  Pulmonary:     Effort: Pulmonary effort is normal. No respiratory distress.     Breath sounds: Normal breath sounds. No wheezing, rhonchi or rales.  Abdominal:     General: Bowel sounds are normal. There is no distension.     Palpations: Abdomen is soft. There is no mass.     Tenderness: There is no abdominal tenderness. There is no guarding or rebound.  Musculoskeletal:        General: No tenderness. Normal range of motion.     Cervical back: Normal range of motion and neck supple.  Lymphadenopathy:     Cervical: No  cervical adenopathy.  Skin:    General: Skin is warm.     Findings: No rash.  Neurological:     Mental Status: She is alert and oriented to person, place, and time.     Cranial Nerves: No cranial nerve deficit.     Motor: No weakness.     Deep Tendon Reflexes: Reflexes normal.  Psychiatric:        Mood and Affect: Mood is not anxious or depressed.    Wt Readings from Last 3 Encounters:  04/18/21 194 lb (88 kg)  10/16/20 208 lb (94.3 kg)  04/16/20 210 lb (95.3 kg)    BP (!) 144/88    Pulse 80  Ht 5\' 4"  (1.626 m)    Wt 194 lb (88 kg)    BMI 33.30 kg/m   Assessment and Plan:  1. Essential (primary) hypertension Chronic.  Controlled.  Stable.  Continue amlodipine benazepril 5-10 mg daily and hydrochlorothiazide 12.5 mg once a day.  Will check renal function panel for GFR and electrolytes. - amLODipine-benazepril (LOTREL) 5-10 MG capsule; Take 1 capsule by mouth daily.  Dispense: 90 capsule; Refill: 1 - hydrochlorothiazide (HYDRODIURIL) 12.5 MG tablet; Take 1 tablet (12.5 mg total) by mouth daily.  Dispense: 90 tablet; Refill: 1 - Renal Function Panel

## 2021-04-19 LAB — RENAL FUNCTION PANEL
Albumin: 4.6 g/dL (ref 3.8–4.8)
BUN/Creatinine Ratio: 27 (ref 12–28)
BUN: 25 mg/dL (ref 8–27)
CO2: 28 mmol/L (ref 20–29)
Calcium: 10.1 mg/dL (ref 8.7–10.3)
Chloride: 100 mmol/L (ref 96–106)
Creatinine, Ser: 0.91 mg/dL (ref 0.57–1.00)
Glucose: 94 mg/dL (ref 70–99)
Phosphorus: 3 mg/dL (ref 3.0–4.3)
Potassium: 4.4 mmol/L (ref 3.5–5.2)
Sodium: 141 mmol/L (ref 134–144)
eGFR: 69 mL/min/{1.73_m2} (ref >59)

## 2021-04-22 DIAGNOSIS — J309 Allergic rhinitis, unspecified: Secondary | ICD-10-CM | POA: Diagnosis not present

## 2021-04-22 DIAGNOSIS — E669 Obesity, unspecified: Secondary | ICD-10-CM | POA: Diagnosis not present

## 2021-04-22 DIAGNOSIS — Z6834 Body mass index (BMI) 34.0-34.9, adult: Secondary | ICD-10-CM | POA: Diagnosis not present

## 2021-04-22 DIAGNOSIS — I10 Essential (primary) hypertension: Secondary | ICD-10-CM | POA: Diagnosis not present

## 2021-04-22 DIAGNOSIS — Z833 Family history of diabetes mellitus: Secondary | ICD-10-CM | POA: Diagnosis not present

## 2021-07-05 ENCOUNTER — Other Ambulatory Visit: Payer: Self-pay | Admitting: Obstetrics and Gynecology

## 2021-07-05 DIAGNOSIS — Z1231 Encounter for screening mammogram for malignant neoplasm of breast: Secondary | ICD-10-CM

## 2021-07-10 DIAGNOSIS — Z780 Asymptomatic menopausal state: Secondary | ICD-10-CM | POA: Diagnosis not present

## 2021-07-10 LAB — HM DEXA SCAN: HM Dexa Scan: NORMAL

## 2021-07-22 ENCOUNTER — Ambulatory Visit: Payer: Medicare PPO

## 2021-07-22 ENCOUNTER — Ambulatory Visit (INDEPENDENT_AMBULATORY_CARE_PROVIDER_SITE_OTHER): Payer: Medicare PPO

## 2021-07-22 DIAGNOSIS — Z0000 Encounter for general adult medical examination without abnormal findings: Secondary | ICD-10-CM | POA: Diagnosis not present

## 2021-07-22 NOTE — Patient Instructions (Signed)
Vanessa Alvarado , ?Thank you for taking time to come for your Medicare Wellness Visit. I appreciate your ongoing commitment to your health goals. Please review the following plan we discussed and let me know if I can assist you in the future.  ? ?Screening recommendations/referrals: ?Colonoscopy: FOBT done 12/28/20; repeat yearly ?Mammogram: done 07/11/20. Scheduled 08/15/21 ?Bone Density: done 07/10/21 ?Recommended yearly ophthalmology/optometry visit for glaucoma screening and checkup ?Recommended yearly dental visit for hygiene and checkup ? ?Vaccinations: ?Influenza vaccine: done 12/17/20 ?Pneumococcal vaccine: done 04/14/19 ?Tdap vaccine: done 01/21/16 ?Shingles vaccine: Shingrix discussed. Please contact your pharmacy for coverage information.  ?Covid-19: done 06/20/19, 07/18/19 & 03/01/20 ? ?Advanced directives: Please bring a copy of your health care power of attorney and living will to the office at your convenience once you have completed those documents.  ? ?Conditions/risks identified: Keep up the great work! ? ?Next appointment: Follow up in one year for your annual wellness visit  ? ? ?Preventive Care 57 Years and Older, Female ?Preventive care refers to lifestyle choices and visits with your health care provider that can promote health and wellness. ?What does preventive care include? ?A yearly physical exam. This is also called an annual well check. ?Dental exams once or twice a year. ?Routine eye exams. Ask your health care provider how often you should have your eyes checked. ?Personal lifestyle choices, including: ?Daily care of your teeth and gums. ?Regular physical activity. ?Eating a healthy diet. ?Avoiding tobacco and drug use. ?Limiting alcohol use. ?Practicing safe sex. ?Taking low-dose aspirin every day. ?Taking vitamin and mineral supplements as recommended by your health care provider. ?What happens during an annual well check? ?The services and screenings done by your health care provider during your  annual well check will depend on your age, overall health, lifestyle risk factors, and family history of disease. ?Counseling  ?Your health care provider may ask you questions about your: ?Alcohol use. ?Tobacco use. ?Drug use. ?Emotional well-being. ?Home and relationship well-being. ?Sexual activity. ?Eating habits. ?History of falls. ?Memory and ability to understand (cognition). ?Work and work Astronomer. ?Reproductive health. ?Screening  ?You may have the following tests or measurements: ?Height, weight, and BMI. ?Blood pressure. ?Lipid and cholesterol levels. These may be checked every 5 years, or more frequently if you are over 76 years old. ?Skin check. ?Lung cancer screening. You may have this screening every year starting at age 50 if you have a 30-pack-year history of smoking and currently smoke or have quit within the past 15 years. ?Fecal occult blood test (FOBT) of the stool. You may have this test every year starting at age 11. ?Flexible sigmoidoscopy or colonoscopy. You may have a sigmoidoscopy every 5 years or a colonoscopy every 10 years starting at age 86. ?Hepatitis C blood test. ?Hepatitis B blood test. ?Sexually transmitted disease (STD) testing. ?Diabetes screening. This is done by checking your blood sugar (glucose) after you have not eaten for a while (fasting). You may have this done every 1-3 years. ?Bone density scan. This is done to screen for osteoporosis. You may have this done starting at age 85. ?Mammogram. This may be done every 1-2 years. Talk to your health care provider about how often you should have regular mammograms. ?Talk with your health care provider about your test results, treatment options, and if necessary, the need for more tests. ?Vaccines  ?Your health care provider may recommend certain vaccines, such as: ?Influenza vaccine. This is recommended every year. ?Tetanus, diphtheria, and acellular pertussis (Tdap, Td) vaccine.  You may need a Td booster every 10  years. ?Zoster vaccine. You may need this after age 41. ?Pneumococcal 13-valent conjugate (PCV13) vaccine. One dose is recommended after age 55. ?Pneumococcal polysaccharide (PPSV23) vaccine. One dose is recommended after age 47. ?Talk to your health care provider about which screenings and vaccines you need and how often you need them. ?This information is not intended to replace advice given to you by your health care provider. Make sure you discuss any questions you have with your health care provider. ?Document Released: 03/30/2015 Document Revised: 11/21/2015 Document Reviewed: 01/02/2015 ?Elsevier Interactive Patient Education ? 2017 [place]. ? ?Fall Prevention in the Home ?Falls can cause injuries. They can happen to people of all ages. There are many things you can do to make your home safe and to help prevent falls. ?What can I do on the outside of my home? ?Regularly fix the edges of walkways and driveways and fix any cracks. ?Remove anything that might make you trip as you walk through a door, such as a raised step or threshold. ?Trim any bushes or trees on the path to your home. ?Use bright outdoor lighting. ?Clear any walking paths of anything that might make someone trip, such as rocks or tools. ?Regularly check to see if handrails are loose or broken. Make sure that both sides of any steps have handrails. ?Any raised decks and porches should have guardrails on the edges. ?Have any leaves, snow, or ice cleared regularly. ?Use sand or salt on walking paths during winter. ?Clean up any spills in your garage right away. This includes oil or grease spills. ?What can I do in the bathroom? ?Use night lights. ?Install grab bars by the toilet and in the tub and shower. Do not use towel bars as grab bars. ?Use non-skid mats or decals in the tub or shower. ?If you need to sit down in the shower, use a plastic, non-slip stool. ?Keep the floor dry. Clean up any water that spills on the floor as soon as it  happens. ?Remove soap buildup in the tub or shower regularly. ?Attach bath mats securely with double-sided non-slip rug tape. ?Do not have throw rugs and other things on the floor that can make you trip. ?What can I do in the bedroom? ?Use night lights. ?Make sure that you have a light by your bed that is easy to reach. ?Do not use any sheets or blankets that are too big for your bed. They should not hang down onto the floor. ?Have a firm chair that has side arms. You can use this for support while you get dressed. ?Do not have throw rugs and other things on the floor that can make you trip. ?What can I do in the kitchen? ?Clean up any spills right away. ?Avoid walking on wet floors. ?Keep items that you use a lot in easy-to-reach places. ?If you need to reach something above you, use a strong step stool that has a grab bar. ?Keep electrical cords out of the way. ?Do not use floor polish or wax that makes floors slippery. If you must use wax, use non-skid floor wax. ?Do not have throw rugs and other things on the floor that can make you trip. ?What can I do with my stairs? ?Do not leave any items on the stairs. ?Make sure that there are handrails on both sides of the stairs and use them. Fix handrails that are broken or loose. Make sure that handrails are as long as  the stairways. ?Check any carpeting to make sure that it is firmly attached to the stairs. Fix any carpet that is loose or worn. ?Avoid having throw rugs at the top or bottom of the stairs. If you do have throw rugs, attach them to the floor with carpet tape. ?Make sure that you have a light switch at the top of the stairs and the bottom of the stairs. If you do not have them, ask someone to add them for you. ?What else can I do to help prevent falls? ?Wear shoes that: ?Do not have high heels. ?Have rubber bottoms. ?Are comfortable and fit you well. ?Are closed at the toe. Do not wear sandals. ?If you use a stepladder: ?Make sure that it is fully opened.  Do not climb a closed stepladder. ?Make sure that both sides of the stepladder are locked into place. ?Ask someone to hold it for you, if possible. ?Clearly mark and make sure that you can see: ?Any grab bars or h

## 2021-07-22 NOTE — Progress Notes (Addendum)
? ?Subjective:  ? Vanessa Alvarado is a 71 y.o. female who presents for Medicare Annual (Subsequent) preventive examination. ? ?Virtual Visit via Telephone Note ? ?I connected with  Takira Sherrin Picha on 07/22/21 at  8:15 AM EDT by telephone and verified that I am speaking with the correct person using two identifiers. ? ?Location: ?Patient: home ?Provider: [facility] ?Persons participating in the virtual visit: patient/Nurse Health Advisor ?  ?I discussed the limitations, risks, security and privacy concerns of performing an evaluation and management service by telephone and the availability of in person appointments. The patient expressed understanding and agreed to proceed. ? ?Interactive audio and video telecommunications were attempted between this nurse and patient, however failed, due to patient having technical difficulties OR patient did not have access to video capability.  We continued and completed visit with audio only. ? ?Some vital signs may be absent or patient reported.  ? ?Reather Littler, LPN ? ? ?Review of Systems    ? ?Cardiac Risk Factors include: advanced age (>30men, >81 women);hypertension ? ?   ?Objective:  ?  ?There were no vitals filed for this visit. ?There is no height or weight on file to calculate BMI. ? ? ?  07/22/2021  ?  8:16 AM 07/18/2020  ?  8:30 AM 07/18/2019  ?  8:09 AM 07/21/2016  ?  9:01 AM 06/01/2015  ?  9:16 AM  ?Advanced Directives  ?Does Patient Have a Medical Advance Directive? No No No No No  ?Would patient like information on creating a medical advance directive? No - Patient declined No - Patient declined No - Patient declined    ? ? ?Current Medications (verified) ?Outpatient Encounter Medications as of 07/22/2021  ?Medication Sig  ? amLODipine-benazepril (LOTREL) 5-10 MG capsule Take 1 capsule by mouth daily.  ? calcium-vitamin D (OSCAL WITH D) 500-200 MG-UNIT tablet Take 1 tablet by mouth daily with breakfast.  ? cetirizine (ZYRTEC) 10 MG tablet Take 10 mg by mouth daily.  ?  hydrochlorothiazide (HYDRODIURIL) 12.5 MG tablet Take 1 tablet (12.5 mg total) by mouth daily.  ? [DISCONTINUED] mupirocin ointment (BACTROBAN) 2 % Apply 1 application topically 2 (two) times daily.  ? ?No facility-administered encounter medications on file as of 07/22/2021.  ? ? ?Allergies (verified) ?Patient has no known allergies.  ? ?History: ?Past Medical History:  ?Diagnosis Date  ? Depression   ? Hypertension   ? ?Past Surgical History:  ?Procedure Laterality Date  ? herniated disc    ? lower back  ? OVARIAN CYST REMOVAL    ? ?Family History  ?Problem Relation Age of Onset  ? Heart disease Father   ? Breast cancer Neg Hx   ? ?Social History  ? ?Socioeconomic History  ? Marital status: Married  ?  Spouse name: Not on file  ? Number of children: 2  ? Years of education: Not on file  ? Highest education level: Associate degree: academic program  ?Occupational History  ? Not on file  ?Tobacco Use  ? Smoking status: Former  ? Smokeless tobacco: Never  ?Vaping Use  ? Vaping Use: Never used  ?Substance and Sexual Activity  ? Alcohol use: Not Currently  ? Drug use: No  ? Sexual activity: Yes  ?Other Topics Concern  ? Not on file  ?Social History Narrative  ? Not on file  ? ?Social Determinants of Health  ? ?Financial Resource Strain: Low Risk   ? Difficulty of Paying Living Expenses: Not hard at all  ?Food Insecurity:  No Food Insecurity  ? Worried About Programme researcher, broadcasting/film/video in the Last Year: Never true  ? Ran Out of Food in the Last Year: Never true  ?Transportation Needs: No Transportation Needs  ? Lack of Transportation (Medical): No  ? Lack of Transportation (Non-Medical): No  ?Physical Activity: Inactive  ? Days of Exercise per Week: 0 days  ? Minutes of Exercise per Session: 0 min  ?Stress: No Stress Concern Present  ? Feeling of Stress : Not at all  ?Social Connections: Moderately Isolated  ? Frequency of Communication with Friends and Family: More than three times a week  ? Frequency of Social Gatherings with  Friends and Family: More than three times a week  ? Attends Religious Services: Never  ? Active Member of Clubs or Organizations: No  ? Attends Banker Meetings: Never  ? Marital Status: Married  ? ? ?Tobacco Counseling ?Counseling given: Not Answered ? ? ?Clinical Intake: ? ?Pre-visit preparation completed: Yes ? ?Pain : No/denies pain ? ?  ? ?Nutritional Risks: None ?Diabetes: No ? ?How often do you need to have someone help you when you read instructions, pamphlets, or other written materials from your doctor or pharmacy?: 1 - Never ? ? ? ?Interpreter Needed?: No ? ?Information entered by :: Reather Littler LPN ? ? ?Activities of Daily Living ? ?  07/22/2021  ?  8:16 AM  ?In your present state of health, do you have any difficulty performing the following activities:  ?Hearing? 0  ?Vision? 0  ?Difficulty concentrating or making decisions? 0  ?Walking or climbing stairs? 0  ?Dressing or bathing? 0  ?Doing errands, shopping? 0  ?Preparing Food and eating ? N  ?Using the Toilet? N  ?In the past six months, have you accidently leaked urine? N  ?Do you have problems with loss of bowel control? N  ?Managing your Medications? N  ?Managing your Finances? N  ?Housekeeping or managing your Housekeeping? N  ? ? ?Patient Care Team: ?Duanne Limerick, MD as PCP - General (Family Medicine) ? ?Indicate any recent Medical Services you may have received from other than [facility] providers in the past year (date may be approximate). ? ?   ?Assessment:  ? This is a routine wellness examination for Vanessa Alvarado. ? ?Hearing/Vision screen ?Hearing Screening - Comments:: Pt denies hearing difficulty ?Vision Screening - Comments:: [facility] vision screenings done at [facility] ? ? ?Dietary issues and exercise activities discussed: ?Current Exercise Habits: The patient does not participate in regular exercise at present, Exercise limited by: None identified ? ? Goals Addressed   ?None ?  ? ?Depression Screen ? ?  07/22/2021  ?  8:15 AM  04/18/2021  ?  9:13 AM 10/16/2020  ?  8:57 AM 07/18/2020  ?  8:29 AM 04/16/2020  ?  8:56 AM 10/12/2019  ?  9:10 AM 07/18/2019  ?  8:08 AM  ?PHQ 2/9 Scores  ?PHQ - 2 Score 0 0 0 0 0 0 0  ?PHQ- 9 Score  0 1  0 1   ?  ?Fall Risk ? ?  07/22/2021  ?  8:16 AM 10/16/2020  ?  8:57 AM 07/18/2020  ?  8:32 AM 04/16/2020  ?  8:56 AM 10/12/2019  ?  9:09 AM  ?Fall Risk   ?Falls in the past year? 0 0 0 0 0  ?Number falls in past yr: 0 0 0    ?Injury with Fall? 0 0 0    ?Risk  for fall due to : No Fall Risks No Fall Risks No Fall Risks    ?Follow up Falls prevention discussed Falls evaluation completed Falls prevention discussed  Falls evaluation completed  ? ? ?FALL RISK PREVENTION PERTAINING TO THE HOME: ? ?Any stairs in or around the home? Yes  ?If so, are there any without handrails? No  ?Home free of loose throw rugs in walkways, pet beds, electrical cords, etc? Yes  ?Adequate lighting in your home to reduce risk of falls? Yes  ? ?ASSISTIVE DEVICES UTILIZED TO PREVENT FALLS: ? ?Life alert? No  ?Use of a cane, walker or w/c? No  ?Grab bars in the bathroom? Yes  ?Shower chair or bench in shower? Yes  ?Elevated toilet seat or a handicapped toilet? Yes  ? ?TIMED UP AND GO: ? ?Was the test performed? No . Telephonic visit.  ? ?Cognitive Function: Normal cognitive status assessed by direct observation by this Nurse Health Advisor. No abnormalities found.  ? ?  ?  ?  ? ?Immunizations ?Immunization History  ?Administered Date(s) Administered  ? Fluad Quad(high Dose 65+) 01/12/2020, 12/17/2020  ? Influenza Split 12/26/2010  ? Influenza, High Dose Seasonal PF 12/01/2017  ? Influenza-Unspecified 11/23/2014, 12/18/2016, 12/19/2016, 02/01/2019  ? Moderna Sars-Covid-2 Vaccination 06/20/2019, 07/18/2019, 03/01/2020  ? Pneumococcal Conjugate-13 12/01/2017  ? Pneumococcal Polysaccharide-23 01/10/2011, 04/14/2019  ? Tdap 01/21/2016  ? ? ?TDAP status: Up to date ? ?Flu Vaccine status: Up to date ? ?Pneumococcal vaccine status: Up to date ? ?Covid-19 vaccine  status: Completed vaccines ? ?Qualifies for Shingles Vaccine? Yes   ?Zostavax completed No   ?Shingrix Completed?: No.    Education has been provided regarding the importance of this vaccine. Patient has been advised to call i

## 2021-08-15 ENCOUNTER — Ambulatory Visit
Admission: RE | Admit: 2021-08-15 | Discharge: 2021-08-15 | Disposition: A | Discharge status: Home / Self Care | Payer: Medicare PPO | Source: Ambulatory Visit | Attending: Obstetrics and Gynecology | Admitting: Obstetrics and Gynecology

## 2021-08-15 DIAGNOSIS — Z1231 Encounter for screening mammogram for malignant neoplasm of breast: Secondary | ICD-10-CM | POA: Diagnosis not present

## 2021-10-16 ENCOUNTER — Ambulatory Visit: Payer: Medicare PPO | Admitting: Family Medicine

## 2021-11-12 ENCOUNTER — Ambulatory Visit: Payer: Medicare PPO | Admitting: Family Medicine

## 2021-11-12 ENCOUNTER — Encounter: Payer: Self-pay | Admitting: Family Medicine

## 2021-11-12 VITALS — BP 130/80 | HR 64 | Ht 64.0 in | Wt 190.0 lb

## 2021-11-12 DIAGNOSIS — E782 Mixed hyperlipidemia: Secondary | ICD-10-CM | POA: Diagnosis not present

## 2021-11-12 DIAGNOSIS — I10 Essential (primary) hypertension: Secondary | ICD-10-CM

## 2021-11-12 DIAGNOSIS — R7303 Prediabetes: Secondary | ICD-10-CM

## 2021-11-12 MED ORDER — HYDROCHLOROTHIAZIDE 12.5 MG PO TABS: 12.5000 mg | ORAL_TABLET | Freq: Every day | ORAL | 1 refills | Status: DC

## 2021-11-12 MED ORDER — AMLODIPINE BESY-BENAZEPRIL HCL 5-10 MG PO CAPS: 1.0000 | ORAL_CAPSULE | Freq: Every day | ORAL | 1 refills | Status: DC

## 2021-11-12 NOTE — Progress Notes (Signed)
Date:  11/12/2021   Name:  Vanessa Alvarado   DOB:  1951-02-21   MRN:  [id number]   Chief Complaint: Hypertension and Prediabetes  Hypertension This is a chronic problem. The current episode started more than 1 year ago. The problem has been gradually improving since onset. The problem is controlled. Pertinent negatives include no anxiety, blurred vision, chest pain, headaches, malaise/fatigue, neck pain, orthopnea, palpitations, peripheral edema, PND, shortness of breath or sweats. There are no associated agents to hypertension.    Lab Results  Component Value Date   NA 141 04/18/2021   K 4.4 04/18/2021   CO2 28 04/18/2021   GLUCOSE 94 04/18/2021   BUN 25 04/18/2021   CREATININE 0.91 04/18/2021   CALCIUM 10.1 04/18/2021   EGFR 69 04/18/2021   GFRNONAA 64 04/16/2020   Lab Results  Component Value Date   CHOL 216 (H) 10/16/2020   HDL 48 10/16/2020   LDLCALC 130 (H) 10/16/2020   TRIG 217 (H) 10/16/2020   CHOLHDL 3.9 09/03/2018   No results found for: "TSH" Lab Results  Component Value Date   HGBA1C 6.1 (H) 12/17/2020   No results found for: "WBC", "HGB", "HCT", "MCV", "PLT" No results found for: "ALT", "AST", "GGT", "ALKPHOS", "BILITOT" No results found for: "25OHVITD2", "25OHVITD3", "VD25OH"   Review of Systems  Constitutional:  Negative for chills, fever and malaise/fatigue.  HENT:  Negative for drooling, ear discharge, ear pain and sore throat.   Eyes:  Negative for blurred vision.  Respiratory:  Negative for cough, shortness of breath and wheezing.   Cardiovascular:  Negative for chest pain, palpitations, orthopnea, leg swelling and PND.  Gastrointestinal:  Negative for abdominal pain, blood in stool, constipation, diarrhea and nausea.  Endocrine: Negative for polydipsia.  Genitourinary:  Negative for dysuria, frequency, hematuria and urgency.  Musculoskeletal:  Negative for back pain, myalgias and neck pain.  Skin:  Negative for rash.  Allergic/Immunologic:  Negative for environmental allergies.  Neurological:  Negative for dizziness and headaches.  Hematological:  Does not bruise/bleed easily.  Psychiatric/Behavioral:  Negative for suicidal ideas. The patient is not nervous/anxious.     Patient Active Problem List   Diagnosis Date Noted   Hyperglycemia 12/17/2020   Mixed hyperlipidemia 04/14/2019   Psoriasis 08/28/2014   Allergic rhinitis, seasonal 08/28/2014   BP (high blood pressure) 08/25/2014   Essential (primary) hypertension 09/05/2013   Pleurisy 09/05/2013    No Known Allergies  Past Surgical History:  Procedure Laterality Date   herniated disc     lower back   OVARIAN CYST REMOVAL      Social History   Tobacco Use   Smoking status: Former   Smokeless tobacco: Never  Vaping Use   Vaping Use: Never used  Substance Use Topics   Alcohol use: Not Currently   Drug use: No     Medication list has been reviewed and updated.  Current Meds  Medication Sig   amLODipine-benazepril (LOTREL) 5-10 MG capsule Take 1 capsule by mouth daily.   calcium-vitamin D (OSCAL WITH D) 500-200 MG-UNIT tablet Take 1 tablet by mouth daily with breakfast.   cetirizine (ZYRTEC) 10 MG tablet Take 10 mg by mouth daily.   hydrochlorothiazide (HYDRODIURIL) 12.5 MG tablet Take 1 tablet (12.5 mg total) by mouth daily.       11/12/2021   10:08 AM 04/18/2021    9:13 AM 10/16/2020    8:57 AM 04/16/2020    8:56 AM  GAD 7 : Generalized Anxiety Score  Nervous, Anxious, on Edge 0 0 0 0  Control/stop worrying 0 0 0 0  Worry too much - different things 0 0 0 0  Trouble relaxing 0 0 0 0  Restless 0 0 0 0  Easily annoyed or irritable 0 0 0 0  Afraid - awful might happen 0 0 0 0  Total GAD 7 Score 0 0 0 0  Anxiety Difficulty Not difficult at all Not difficult at all         11/12/2021   10:08 AM 07/22/2021    8:15 AM 04/18/2021    9:13 AM  Depression screen PHQ 2/9  Decreased Interest 0 0 0  Down, Depressed, Hopeless 0 0 0  PHQ - 2 Score 0 0 0   Altered sleeping 0  0  Tired, decreased energy 0  0  Change in appetite 0  0  Feeling bad or failure about yourself  0  0  Trouble concentrating 0  0  Moving slowly or fidgety/restless 0  0  Suicidal thoughts 0  0  PHQ-9 Score 0  0  Difficult doing work/chores Not difficult at all  Not difficult at all    BP Readings from Last 3 Encounters:  11/12/21 130/80  04/18/21 128/80  10/16/20 124/82    Physical Exam Vitals and nursing note reviewed. Exam conducted with a chaperone present.  Constitutional:      General: She is not in acute distress.    Appearance: She is not diaphoretic.  HENT:     Head: Normocephalic and atraumatic.     Right Ear: Tympanic membrane and external ear normal.     Left Ear: Tympanic membrane and external ear normal.     Nose: Nose normal.     Mouth/Throat:     Mouth: Mucous membranes are moist.  Eyes:     General:        Right eye: No discharge.        Left eye: No discharge.     Conjunctiva/sclera: Conjunctivae normal.     Pupils: Pupils are equal, round, and reactive to light.  Neck:     Thyroid: No thyromegaly.     Vascular: No JVD.  Cardiovascular:     Rate and Rhythm: Normal rate and regular rhythm.     Heart sounds: Normal heart sounds. No murmur heard.    No friction rub. No gallop.  Pulmonary:     Effort: Pulmonary effort is normal.     Breath sounds: Normal breath sounds.  Abdominal:     General: Bowel sounds are normal.     Palpations: Abdomen is soft. There is no mass.     Tenderness: There is no abdominal tenderness. There is no guarding.  Musculoskeletal:        General: Normal range of motion.     Cervical back: Normal range of motion and neck supple.  Lymphadenopathy:     Cervical: No cervical adenopathy.  Skin:    General: Skin is warm and dry.  Neurological:     Mental Status: She is alert.     Deep Tendon Reflexes: Reflexes are normal and symmetric.     Wt Readings from Last 3 Encounters:  11/12/21 190 lb (86.2  kg)  04/18/21 194 lb (88 kg)  10/16/20 208 lb (94.3 kg)    BP 130/80   Pulse 64   Ht _0  (1.626 m)   Wt 190 lb (86.2 kg)   BMI 32.61 kg/m   Assessment and Plan:  1. Essential (primary) hypertension Chronic.  Controlled.  Stable.  Blood pressure 130/80.  Continue amlodipine benazepril 5-10 mg once a day.  Also continue hydrochlorothiazide 12.5 mg 1 tablet daily.  We will check CMP for electrolytes and GFR. - amLODipine-benazepril (LOTREL) 5-10 MG capsule; Take 1 capsule by mouth daily.  Dispense: 90 capsule; Refill: 1 - hydrochlorothiazide (HYDRODIURIL) 12.5 MG tablet; Take 1 tablet (12.5 mg total) by mouth daily.  Dispense: 90 tablet; Refill: 1 - Comprehensive Metabolic Panel (CMET)  2. Mixed hyperlipidemia Chronic.  Episodic.  Diet controlled.  Last evaluation noted elevations above 200 at the triglycerides and 130 on the LDL.  Dietary guidelines with low-cholesterol low triglycerides reemphasized and we will check lipid panel for current status. - Lipid Panel With LDL/HDL Ratio  3. Prediabetes New onset.  It was noted that patient was in prediabetic range with A1c and has been controlling with elimination of concentrated sweets and control of carbohydrate intake.  We will check A1c for current status. - HgB A1c    Otilio Miu, MD

## 2021-11-12 NOTE — Patient Instructions (Signed)

## 2021-11-13 LAB — COMPREHENSIVE METABOLIC PANEL
ALT: 22 IU/L (ref 0–32)
AST: 23 IU/L (ref 0–40)
Albumin/Globulin Ratio: 1.9 (ref 1.2–2.2)
Albumin: 4.8 g/dL (ref 3.9–4.9)
Alkaline Phosphatase: 83 IU/L (ref 44–121)
BUN/Creatinine Ratio: 20 (ref 12–28)
BUN: 20 mg/dL (ref 8–27)
Bilirubin Total: 0.4 mg/dL (ref 0.0–1.2)
CO2: 28 mmol/L (ref 20–29)
Calcium: 10.4 mg/dL — ABNORMAL HIGH (ref 8.7–10.3)
Chloride: 101 mmol/L (ref 96–106)
Creatinine, Ser: 1.02 mg/dL — ABNORMAL HIGH (ref 0.57–1.00)
Globulin, Total: 2.5 g/dL (ref 1.5–4.5)
Glucose: 103 mg/dL — ABNORMAL HIGH (ref 70–99)
Potassium: 4.8 mmol/L (ref 3.5–5.2)
Sodium: 142 mmol/L (ref 134–144)
Total Protein: 7.3 g/dL (ref 6.0–8.5)
eGFR: 60 mL/min/{1.73_m2} (ref >59)

## 2021-11-13 LAB — LIPID PANEL WITH LDL/HDL RATIO
Cholesterol, Total: 223 mg/dL — ABNORMAL HIGH (ref 100–199)
HDL: 59 mg/dL (ref >39)
LDL Chol Calc (NIH): 121 mg/dL — ABNORMAL HIGH (ref 0–99)
LDL/HDL Ratio: 2.1 ratio (ref 0.0–3.2)
Triglycerides: 246 mg/dL — ABNORMAL HIGH (ref 0–149)
VLDL Cholesterol Cal: 43 mg/dL — ABNORMAL HIGH (ref 5–40)

## 2021-11-13 LAB — HEMOGLOBIN A1C
Est. average glucose Bld gHb Est-mCnc: 128 mg/dL
Hgb A1c MFr Bld: 6.1 % — ABNORMAL HIGH (ref 4.8–5.6)

## 2022-05-15 ENCOUNTER — Ambulatory Visit: Payer: Medicare PPO | Admitting: Family Medicine

## 2022-05-15 ENCOUNTER — Encounter: Payer: Self-pay | Admitting: Family Medicine

## 2022-05-15 VITALS — BP 129/76 | HR 78 | Ht 64.0 in | Wt 201.0 lb

## 2022-05-15 DIAGNOSIS — E782 Mixed hyperlipidemia: Secondary | ICD-10-CM

## 2022-05-15 DIAGNOSIS — I10 Essential (primary) hypertension: Secondary | ICD-10-CM | POA: Diagnosis not present

## 2022-05-15 DIAGNOSIS — R7303 Prediabetes: Secondary | ICD-10-CM

## 2022-05-15 DIAGNOSIS — Z1211 Encounter for screening for malignant neoplasm of colon: Secondary | ICD-10-CM | POA: Diagnosis not present

## 2022-05-15 MED ORDER — HYDROCHLOROTHIAZIDE 12.5 MG PO TABS: 12.5000 mg | ORAL_TABLET | Freq: Every day | ORAL | 1 refills | Status: DC

## 2022-05-15 MED ORDER — AMLODIPINE BESY-BENAZEPRIL HCL 5-10 MG PO CAPS: 1.0000 | ORAL_CAPSULE | Freq: Every day | ORAL | 1 refills | Status: DC

## 2022-05-15 NOTE — Progress Notes (Signed)
Date:  05/15/2022   Name:  Vanessa Alvarado   DOB:  October 02, 1950   MRN:  [id number]   Chief Complaint: Prediabetes and Hypertension  Hypertension This is a chronic problem. The current episode started more than 1 year ago. The problem has been gradually improving since onset. The problem is controlled. Pertinent negatives include no blurred vision, chest pain, orthopnea, palpitations, PND or shortness of breath. There are no associated agents to hypertension. Risk factors for coronary artery disease include dyslipidemia. Past treatments include calcium channel blockers and diuretics. The current treatment provides moderate improvement. There are no compliance problems.  There is no history of angina, CAD/MI or CVA. There is no history of chronic renal disease, a hypertension causing med or renovascular disease.  Diabetes She presents for her follow-up diabetic visit. Diabetes type: prdiabetes. Her disease course has been stable. There are no hypoglycemic associated symptoms. Pertinent negatives for diabetes include no blurred vision, no chest pain, no polydipsia and no polyuria. There are no hypoglycemic complications. Symptoms are stable. There are no diabetic complications. Pertinent negatives for diabetic complications include no CVA. There are no known risk factors for coronary artery disease. Current diabetic treatment includes diet.    Lab Results  Component Value Date   NA 142 11/12/2021   K 4.8 11/12/2021   CO2 28 11/12/2021   GLUCOSE 103 (H) 11/12/2021   BUN 20 11/12/2021   CREATININE 1.02 (H) 11/12/2021   CALCIUM 10.4 (H) 11/12/2021   EGFR 60 11/12/2021   GFRNONAA 64 04/16/2020   Lab Results  Component Value Date   CHOL 223 (H) 11/12/2021   HDL 59 11/12/2021   LDLCALC 121 (H) 11/12/2021   TRIG 246 (H) 11/12/2021   CHOLHDL 3.9 09/03/2018   No results found for: "TSH" Lab Results  Component Value Date   HGBA1C 6.1 (H) 11/12/2021   No results found for: "WBC", "HGB",  "HCT", "MCV", "PLT" Lab Results  Component Value Date   ALT 22 11/12/2021   AST 23 11/12/2021   ALKPHOS 83 11/12/2021   BILITOT 0.4 11/12/2021   No results found for: "25OHVITD2", "25OHVITD3", "VD25OH"   Review of Systems  HENT:  Negative for trouble swallowing.   Eyes:  Negative for blurred vision.  Respiratory:  Negative for cough, chest tightness, shortness of breath and wheezing.   Cardiovascular:  Negative for chest pain, palpitations, orthopnea, leg swelling and PND.  Gastrointestinal:  Negative for abdominal pain and blood in stool.  Endocrine: Negative for polydipsia and polyuria.  Genitourinary:  Negative for difficulty urinating and menstrual problem.    Patient Active Problem List   Diagnosis Date Noted   Hyperglycemia 12/17/2020   Mixed hyperlipidemia 04/14/2019   Psoriasis 08/28/2014   Allergic rhinitis, seasonal 08/28/2014   BP (high blood pressure) 08/25/2014   Essential (primary) hypertension 09/05/2013   Pleurisy 09/05/2013    No Known Allergies  Past Surgical History:  Procedure Laterality Date   herniated disc     lower back   OVARIAN CYST REMOVAL      Social History   Tobacco Use   Smoking status: Former   Smokeless tobacco: Never  Vaping Use   Vaping Use: Never used  Substance Use Topics   Alcohol use: Not Currently   Drug use: No     Medication list has been reviewed and updated.  Current Meds  Medication Sig   amLODipine-benazepril (LOTREL) 5-10 MG capsule Take 1 capsule by mouth daily.   calcium-vitamin D (OSCAL WITH D)  500-200 MG-UNIT tablet Take 1 tablet by mouth daily with breakfast.   cetirizine (ZYRTEC) 10 MG tablet Take 10 mg by mouth daily.   hydrochlorothiazide (HYDRODIURIL) 12.5 MG tablet Take 1 tablet (12.5 mg total) by mouth daily.       05/15/2022    9:31 AM 11/12/2021   10:08 AM 04/18/2021    9:13 AM 10/16/2020    8:57 AM  GAD 7 : Generalized Anxiety Score  Nervous, Anxious, on Edge 0 0 0 0  Control/stop worrying 0  0 0 0  Worry too much - different things 0 0 0 0  Trouble relaxing 0 0 0 0  Restless 0 0 0 0  Easily annoyed or irritable 0 0 0 0  Afraid - awful might happen 0 0 0 0  Total GAD 7 Score 0 0 0 0  Anxiety Difficulty Not difficult at all Not difficult at all Not difficult at all        05/15/2022    9:31 AM 11/12/2021   10:08 AM 07/22/2021    8:15 AM  Depression screen PHQ 2/9  Decreased Interest 0 0 0  Down, Depressed, Hopeless 0 0 0  PHQ - 2 Score 0 0 0  Altered sleeping 0 0   Tired, decreased energy 0 0   Change in appetite 0 0   Feeling bad or failure about yourself  0 0   Trouble concentrating 0 0   Moving slowly or fidgety/restless 0 0   Suicidal thoughts 0 0   PHQ-9 Score 0 0   Difficult doing work/chores Not difficult at all Not difficult at all     BP Readings from Last 3 Encounters:  05/15/22 129/76  11/12/21 130/80  04/18/21 128/80    Physical Exam Vitals and nursing note reviewed. Exam conducted with a chaperone present.  Constitutional:      General: She is not in acute distress.    Appearance: She is not diaphoretic.  HENT:     Head: Normocephalic and atraumatic.     Right Ear: Tympanic membrane and external ear normal.     Left Ear: Tympanic membrane and external ear normal.     Nose: Nose normal.     Mouth/Throat:     Mouth: Mucous membranes are moist.  Eyes:     General:        Right eye: No discharge.        Left eye: No discharge.     Conjunctiva/sclera: Conjunctivae normal.     Pupils: Pupils are equal, round, and reactive to light.  Neck:     Thyroid: No thyromegaly.     Vascular: No JVD.  Cardiovascular:     Rate and Rhythm: Normal rate and regular rhythm.     Heart sounds: Normal heart sounds. No murmur heard.    No friction rub. No gallop.  Pulmonary:     Effort: Pulmonary effort is normal.     Breath sounds: Normal breath sounds. No wheezing, rhonchi or rales.  Abdominal:     General: Bowel sounds are normal.     Palpations: Abdomen  is soft. There is no mass.     Tenderness: There is no abdominal tenderness. There is no guarding.  Musculoskeletal:        General: Normal range of motion.     Cervical back: Normal range of motion and neck supple.  Lymphadenopathy:     Cervical: No cervical adenopathy.  Skin:    General: Skin is warm and  dry.  Neurological:     Mental Status: She is alert.     Wt Readings from Last 3 Encounters:  05/15/22 201 lb (91.2 kg)  11/12/21 190 lb (86.2 kg)  04/18/21 194 lb (88 kg)    BP 129/76   Pulse 78   Ht '5\' 4"'$  (1.626 m)   Wt 201 lb (91.2 kg)   SpO2 98%   BMI 34.50 kg/m   Assessment and Plan:  1. Essential (primary) hypertension Chronic.  Controlled.  Stable.  Blood pressure today is 129/76.  Asymptomatic.  Tolerating medications well.  Continue amlodipine benazepril 5-10 mg once a day and hydrochlorothiazide 12.5 mg once a day.  Will check renal function panel for electrolytes and GFR.  Will recheck patient in 6 months. - amLODipine-benazepril (LOTREL) 5-10 MG capsule; Take 1 capsule by mouth daily.  Dispense: 90 capsule; Refill: 1 - hydrochlorothiazide (HYDRODIURIL) 12.5 MG tablet; Take 1 tablet (12.5 mg total) by mouth daily.  Dispense: 90 tablet; Refill: 1 - Renal Function Panel  2. Prediabetes .  A1c in 6.1 to last 3.  Measurements.  Will recheck A1c and we encourage that she avoid concentrated sweets and Surveillance of her coverage hydrates.  Will recheck in 6 months. - Renal Function Panel - HgB A1c  3. Mixed hyperlipidemia Chronic.  Controlled.  Stable.  Lipid has been elevated triglycerides and LDL.  Patient has been given low-cholesterol dietary guidelines.  Will recheck in 6 months. - Lipid Panel With LDL/HDL Ratio  4. Colon cancer screening Gust with patient and patient agrees to do FIT testing. - Fecal occult blood, imunochemical([facility])    Otilio Miu, MD

## 2022-05-15 NOTE — Patient Instructions (Signed)
GUIDELINES FOR  LOW-CHOLESTEROL, LOW-TRIGLYCERIDE DIETS    FOODS TO USE   MEATS, FISH Choose lean meats (chicken, turkey, veal, and non-fatty cuts of beef with excess fat trimmed; one serving = 3 oz of cooked meat). Also, fresh or frozen fish, canned fish packed in water, and shellfish (lobster, crabs, shrimp, and oysters). Limit use to no more than one serving of one of these per week. Shellfish are high in cholesterol but low in saturated fat and should be used sparingly. Meats and fish should be broiled (pan or oven) or baked on a rack.  EGGS Egg substitutes and egg whites (use freely). Egg yolks (limit two per week).  FRUITS Eat three servings of fresh fruit per day (1 serving =  cup). Be sure to have at least one citrus fruit daily. Frozen and canned fruit with no sugar or syrup added may be used.  VEGETABLES Most vegetables are not limited (see next page). One dark-green (string beans, escarole) or one deep yellow (squash) vegetable is recommended daily. Cauliflower, broccoli, and celery, as well as potato skins, are recommended for their fiber content. (Fiber is associated with cholesterol reduction) It is preferable to steam vegetables, but they may be boiled, strained, or braised with polyunsaturated vegetable oil (see below).  BEANS Dried peas or beans (1 serving =  cup) may be used as a bread substitute.  NUTS Almonds, walnuts, and peanuts may be used sparingly  (1 serving = 1 Tablespoonful). Use pumpkin, sesame, or sunflower seeds.  BREADS, GRAINS One roll or one slice of whole grain or enriched bread may be used, or three soda crackers or four pieces of melba toast as a substitute. Spaghetti, rice or noodles ( cup) or  large ear of corn may be used as a bread substitute. In preparing these foods do not use butter or shortening, use soft margarine. Also use egg and sugar substitutes.  Choose high fiber grains, such as oats and whole wheat.  CEREALS Use  cup of hot cereal or  cup of  cold cereal per day. Add a sugar substitute if desired, with 99% fat free or skim milk.  MILK PRODUCTS Always use 99% fat free or skim milk, dairy products such as low fat cheeses (farmer's uncreamed diet cottage), low-fat yogurt, and powdered skim milk.  FATS, OILS Use soft (not stick) margarine; vegetable oils that are high in polyunsaturated fats (such as safflower, sunflower, soybean, corn, and cottonseed). Always refrigerate meat drippings to harden the fat and remove it before preparing gravies  DESSERTS, SNACKS Limit to two servings per day; substitute each serving for a bread/cereal serving: ice milk, water sherbet (1/4 cup); unflavored gelatin or gelatin flavored with sugar substitute (1/3 cup); pudding prepared with skim milk (1/2 cup); egg white souffls; unbuttered popcorn (1  cups). Substitute carob for chocolate.  BEVERAGES Fresh fruit juices (limit 4 oz per day); black coffee, plain or herbal teas; soft drinks with sugar substitutes; club soda, preferably salt-free; cocoa made with skim milk or nonfat dried milk and water (sugar substitute added if desired); clear broth. Alcohol: limit two servings per day (see second page).  MISCELLANEOUS  You may use the following freely: vinegar, spices, herbs, nonfat bouillon, mustard, Worcestershire sauce, soy sauce, flavoring essence.                  GUIDELINES FOR  LOW-CHOLESTEROL, LOW TRIGLYCERIDE DIETS    FOODS TO AVOID   MEATS, FISH Marbled beef, pork, bacon, sausage, and other pork products; fatty   fowl (duck, goose); skin and fat of turkey and chicken; processed meats; luncheon meats (salami, bologna); frankfurters and fast-food hamburgers (theyre loaded with fat); organ meats (kidneys, liver); canned fish packed in oil.  EGGS Limit egg yolks to two per week.   FRUITS Coconuts (rich in saturated fats).  VEGETABLES Avoid avocados. Starchy vegetables (potatoes, corn, lima beans, dried peas, beans) may be used only if  substitutes for a serving of bread or cereal. (Baked potato skin, however, is desirable for its fiber content.  BEANS Commercial baked beans with sugar and/or pork added.  NUTS Avoid nuts.  Limit peanuts and walnuts to one tablespoonful per day.  BREADS, GRAINS Any baked goods with shortening and/or sugar. Commercial mixes with dried eggs and whole milk. Avoid sweet rolls, doughnuts, breakfast pastries (Danish), and sweetened packaged cereals (the added sugar converts readily to triglycerides).  MILK PRODUCTS Whole milk and whole-milk packaged goods; cream; ice cream; whole-milk puddings, yogurt, or cheeses; nondairy cream substitutes.  FATS, OILS Butter, lard, animal fats, bacon drippings, gravies, cream sauces as well as palm and coconut oils. All these are high in saturated fats. Examine labels on cholesterol free products for hydrogenated fats. (These are oils that have been hardened into solids and in the process have become saturated.)  DESSERTS, SNACKS Fried snack foods like potato chips; chocolate; candies in general; jams, jellies, syrups; whole- milk puddings; ice cream and milk sherbets; hydrogenated peanut butter.  BEVERAGES Sugared fruit juices and soft drinks; cocoa made with whole milk and/or sugar. When using alcohol (1 oz liquor, 5 oz beer, or 2  oz dry table wine per serving), one serving must be substituted for one bread or cereal serving (limit, two servings of alcohol per day).   SPECIAL NOTES    Remember that even non-limited foods should be used in moderation. While on a cholesterol-lowering diet, be sure to avoid animal fats and marbled meats. 3. While on a triglyceride-lowering diet, be sure to avoid sweets and to control the amount of carbohydrates you eat (starchy foods such as flour, bread, potatoes).While on a tri-glyceride-lowering diet, be sure to avoid sweets Buy a good low-fat cookbook, such as the one published by the American Heart Association. Consult your physician  if you have any questions.               [facility] Low Glycemic Diet Plan   Low Glycemic Foods (20-49) Moderate Glycemic Foods (50-69) High Glycemic Foods (70-100)      Breakfast Creals Breakfast Cereals Breakfast Cereals  All Bran All-Bran Fruit'n Oats   Bran Buds Bran Chex   Cheerios Corn chex    Fiber One Oatmeal (not instant)   Just Right Mini-Wheats   Corn Flakes Cream of Wheat    Oat Bran Special K Swiss Muesli   Grape Nuts Grape Nut Flakes      Grits Nutri-Grain    Fruits and fruit juice: Fruits Puffed Rice Puffed Wheat    (Limit to 1-2 Servings per day) Banana (under-ride) Dates   Rice Chex Rice Krispies    Apples Apricots (fresh/dried)   Figs Grapes   Shredded Wheat Team    Blackberries Blueberries   Kiwi Mango   Total     Cherries Cranberries   Oranges Raisins     Peaches Pears    Fruits  Plums Prunes   Fruit Juices Pineapple Watermelon    Grapefruit Raspberries   Cranberry Juice Orange Juice   Banana (over-ripe)     Strawberries Tangerines        Apple Juice Grapefruit Juice   Beans and Legumes Beverages  Tomato Juice    Boston-type baked beans Sodas, sweet tea, pineapple juice   Canned pinto, kidney, or navy beans   Beans and Legumes (fresh-cooked) Green peas Vegetables  Black-eyed peas Butter Beans    Potato, baked, boiled, fried, mashed  Chick peas Lentils   Vegetables French fries  Green beans Lima beans   Beets Carrots   Canned or frozen corn  Kidney beans Navy beans   Sweet potato Yam   Parsnips  Pinto beans Snow peas   Corn on the cob Winter squash      Non-starchy vegetables Grains Breads  Asparagus, avocado, broccoli, cabbage Cornmeal Rice, brown   Most breads (white and whole grain)  cauliflower, celery, cucumber, greens Rice, white Couscous   Bagels Bread sticks    lettuce, mushrooms, peppers, tomatoes  Bread stuffing Kaiser roll    okra, onions, spinach, summer squash Pasta Dinner rolls   Macaroni  Pizza, cheese     Grains Ravioli, meat filled Spaghetti, white   Grains  Barley Bulgur    Rice, instant Tapioca, with milk    Rye Wild rice   Nuts    Cashews Macadamia   Candy and most cookies  Nuts and oils    Almonds, peanuts, sunflower seeds Snacks Snacks  hazelnuts, pecans, walnuts Chocolate Ice cream, lowfat   Donuts Corn chips    Oils that are liquid at room temperature Muffin Popcorn   Jelly beans Pretzels      Pastries  Dairy, fish, meat, soy, and eggs    Milk, skim Lowfat cheese    Restaurant and ethnic foods  Yogurt, lowfat, fruit sugar sweetened  Most Chinese food (sugar in stir fry    or wok sauce)  Lean red meat Fish    Teriyaki-style meats and vegetables  Skinless chicken and turkey, shellfish        Egg whites (up to 3 daily), Soy Products    Egg yolks (up to 7 or _____ per week)      

## 2022-05-16 LAB — RENAL FUNCTION PANEL
Albumin: 4.7 g/dL (ref 3.9–4.9)
BUN/Creatinine Ratio: 18 (ref 12–28)
BUN: 16 mg/dL (ref 8–27)
CO2: 26 mmol/L (ref 20–29)
Calcium: 10.6 mg/dL — ABNORMAL HIGH (ref 8.7–10.3)
Chloride: 98 mmol/L (ref 96–106)
Creatinine, Ser: 0.9 mg/dL (ref 0.57–1.00)
Glucose: 110 mg/dL — ABNORMAL HIGH (ref 70–99)
Phosphorus: 3.5 mg/dL (ref 3.0–4.3)
Potassium: 4.2 mmol/L (ref 3.5–5.2)
Sodium: 139 mmol/L (ref 134–144)
eGFR: 69 mL/min/{1.73_m2} (ref >59)

## 2022-05-16 LAB — LIPID PANEL WITH LDL/HDL RATIO
Cholesterol, Total: 222 mg/dL — ABNORMAL HIGH (ref 100–199)
HDL: 57 mg/dL (ref >39)
LDL Chol Calc (NIH): 124 mg/dL — ABNORMAL HIGH (ref 0–99)
LDL/HDL Ratio: 2.2 ratio (ref 0.0–3.2)
Triglycerides: 234 mg/dL — ABNORMAL HIGH (ref 0–149)
VLDL Cholesterol Cal: 41 mg/dL — ABNORMAL HIGH (ref 5–40)

## 2022-05-16 LAB — HEMOGLOBIN A1C
Est. average glucose Bld gHb Est-mCnc: 126 mg/dL
Hgb A1c MFr Bld: 6 % — ABNORMAL HIGH (ref 4.8–5.6)

## 2022-05-23 ENCOUNTER — Telehealth: Payer: Self-pay

## 2022-05-23 NOTE — Telephone Encounter (Signed)
Called pt to remind her to turn in FIT test next week

## 2022-05-29 DIAGNOSIS — Z1211 Encounter for screening for malignant neoplasm of colon: Secondary | ICD-10-CM | POA: Diagnosis not present

## 2022-05-31 LAB — FECAL OCCULT BLOOD, IMMUNOCHEMICAL: Fecal Occult Bld: NEGATIVE

## 2022-07-10 ENCOUNTER — Telehealth: Payer: Self-pay | Admitting: Family Medicine

## 2022-07-10 NOTE — Telephone Encounter (Signed)
Contacted Vanessa Alvarado to schedule their annual wellness visit. Appointment made for 08/06/2022.  Verlee Rossetti; Care Guide Ambulatory Clinical Support [place] [facility] Direct Dial: [phone]

## 2022-07-25 ENCOUNTER — Other Ambulatory Visit: Payer: Self-pay | Admitting: Family Medicine

## 2022-07-25 DIAGNOSIS — Z1231 Encounter for screening mammogram for malignant neoplasm of breast: Secondary | ICD-10-CM

## 2022-08-06 ENCOUNTER — Ambulatory Visit (INDEPENDENT_AMBULATORY_CARE_PROVIDER_SITE_OTHER): Payer: Medicare PPO

## 2022-08-06 VITALS — Ht 64.0 in | Wt 201.0 lb

## 2022-08-06 DIAGNOSIS — Z0000 Encounter for general adult medical examination without abnormal findings: Secondary | ICD-10-CM | POA: Diagnosis not present

## 2022-08-06 NOTE — Progress Notes (Signed)
I connected with  Vanessa Alvarado on 08/06/22 by a  enabled telemedicine application anaudiod verified that I am speaking with the correct person using two identifiers.  Patient Location: Home  Provider Location: Office/Clinic  I discussed the limitations of evaluation and management by telemedicine. The patient expressed understanding and agreed to proceed.  Subjective:   Vanessa Alvarado is a 72 y.o. female who presents for Medicare Annual (Subsequent) preventive examination.  Review of Systems     Cardiac Risk Factors include: advanced age (>25men, >11 women);hypertension     Objective:    There were no vitals filed for this visit. There is no height or weight on file to calculate BMI.     08/06/2022    8:36 AM 07/22/2021    8:16 AM 07/18/2020    8:30 AM 07/18/2019    8:09 AM 07/21/2016    9:01 AM 06/01/2015    9:16 AM  Advanced Directives  Does Patient Have a Medical Advance Directive? No No No No No No  Would patient like information on creating a medical advance directive? No - Patient declined No - Patient declined No - Patient declined No - Patient declined      Current Medications (verified) Outpatient Encounter Medications as of 08/06/2022  Medication Sig   amLODipine-benazepril (LOTREL) 5-10 MG capsule Take 1 capsule by mouth daily.   calcium-vitamin D (OSCAL WITH D) 500-200 MG-UNIT tablet Take 1 tablet by mouth daily with breakfast.   cetirizine (ZYRTEC) 10 MG tablet Take 10 mg by mouth daily.   hydrochlorothiazide (HYDRODIURIL) 12.5 MG tablet Take 1 tablet (12.5 mg total) by mouth daily.   No facility-administered encounter medications on file as of 08/06/2022.    Allergies (verified) Patient has no known allergies.   History: Past Medical History:  Diagnosis Date   Depression    Hypertension    Past Surgical History:  Procedure Laterality Date   herniated disc     lower back   OVARIAN CYST REMOVAL     Family History  Problem Relation Age of Onset    Heart disease Father    Breast cancer Neg Hx    Social History   Socioeconomic History   Marital status: Married    Spouse name: Not on file   Number of children: 2   Years of education: Not on file   Highest education level: Associate degree: academic program  Occupational History   Not on file  Tobacco Use   Smoking status: Former   Smokeless tobacco: Never  Building services engineer Use: Never used  Substance and Sexual Activity   Alcohol use: Not Currently   Drug use: No   Sexual activity: Yes  Other Topics Concern   Not on file  Social History Narrative   Not on file   Social Determinants of Health   Financial Resource Strain: Low Risk  (08/06/2022)   Overall Financial Resource Strain (CARDIA)    Difficulty of Paying Living Expenses: Not hard at all  Food Insecurity: No Food Insecurity (08/06/2022)   Hunger Vital Sign    Worried About Running Out of Food in the Last Year: Never true    Ran Out of Food in the Last Year: Never true  Transportation Needs: No Transportation Needs (08/06/2022)   PRAPARE - Administrator, Civil Service (Medical): No    Lack of Transportation (Non-Medical): No  Physical Activity: Insufficiently Active (08/06/2022)   Exercise Vital Sign    Days of Exercise  per Week: 2 days    Minutes of Exercise per Session: 20 min  Stress: No Stress Concern Present (08/06/2022)   [employer] of Occupational Health - Occupational Stress Questionnaire    Feeling of Stress : Not at all  Social Connections: Moderately Integrated (08/06/2022)   Social Connection and Isolation Panel [NHANES]    Frequency of Communication with Friends and Family: Three times a week    Frequency of Social Gatherings with Friends and Family: Once a week    Attends Religious Services: More than 4 times per year    Active Member of [employer] or Organizations: No    Attends Engineer, structural: Never    Marital Status: Married    Tobacco Counseling Counseling  given: Not Answered   Clinical Intake:  Pre-visit preparation completed: Yes  Pain : No/denies pain     Nutritional Risks: None Diabetes: No  How often do you need to have someone help you when you read instructions, pamphlets, or other written materials from your doctor or pharmacy?: 1 - Never  Diabetic?no  Interpreter Needed?: No  Information entered by :: Kennedy Bucker, LPN   Activities of Daily Living    08/06/2022    8:37 AM  In your present state of health, do you have any difficulty performing the following activities:  Hearing? 0  Vision? 0  Difficulty concentrating or making decisions? 0  Walking or climbing stairs? 0  Dressing or bathing? 0  Doing errands, shopping? 0  Preparing Food and eating ? N  Using the Toilet? N  In the past six months, have you accidently leaked urine? N  Do you have problems with loss of bowel control? N  Managing your Medications? N  Managing your Finances? N  Housekeeping or managing your Housekeeping? N    Patient Care Team: Duanne Limerick, MD as PCP - General (Family Medicine)  Indicate any recent Medical Services you may have received from other than [facility] providers in the past year (date may be approximate).     Assessment:   This is a routine wellness examination for Vanessa Alvarado.  Hearing/Vision screen Hearing Screening - Comments:: No aids Vision Screening - Comments:: Wears glasses- Dr.Shade  Dietary issues and exercise activities discussed: Current Exercise Habits: Home exercise routine, Type of exercise: Other - see comments (yard work), Time (Minutes): 20, Frequency (Times/Week): 2, Weekly Exercise (Minutes/Week): 40, Intensity: Mild   Goals Addressed             This Visit's Progress    DIET - EAT MORE FRUITS AND VEGETABLES         Depression Screen    08/06/2022    8:35 AM 05/15/2022    9:31 AM 11/12/2021   10:08 AM 07/22/2021    8:15 AM 04/18/2021    9:13 AM 10/16/2020    8:57 AM 07/18/2020    8:29 AM   PHQ 2/9 Scores  PHQ - 2 Score 0 0 0 0 0 0 0  PHQ- 9 Score 0 0 0  0 1     Fall Risk    08/06/2022    8:37 AM 05/15/2022    9:31 AM 11/12/2021   10:08 AM 07/22/2021    8:16 AM 10/16/2020    8:57 AM  Fall Risk   Falls in the past year? 0 0 0 0 0  Number falls in past yr: 0 0 0 0 0  Injury with Fall? 0 0 0 0 0  Risk for fall due  to : No Fall Risks No Fall Risks No Fall Risks No Fall Risks No Fall Risks  Follow up Falls prevention discussed;Falls evaluation completed Falls evaluation completed Falls evaluation completed Falls prevention discussed Falls evaluation completed    FALL RISK PREVENTION PERTAINING TO THE HOME:  Any stairs in or around the home? Yes  If so, are there any without handrails? No  Home free of loose throw rugs in walkways, pet beds, electrical cords, etc? Yes  Adequate lighting in your home to reduce risk of falls? Yes   ASSISTIVE DEVICES UTILIZED TO PREVENT FALLS:  Life alert? No  Use of a cane, walker or w/c? No  Grab bars in the bathroom? Yes  Shower chair or bench in shower? No  Elevated toilet seat or a handicapped toilet? No    Cognitive Function:        08/06/2022    8:42 AM  6CIT Screen  What Year? 0 points  What month? 0 points  What time? 0 points  Count back from 20 0 points  Months in reverse 0 points  Repeat phrase 0 points  Total Score 0 points    Immunizations Immunization History  Administered Date(s) Administered   Fluad Quad(high Dose 65+) 01/12/2020, 12/17/2020   Influenza Split 12/26/2010   Influenza, High Dose Seasonal PF 12/01/2017   Influenza-Unspecified 11/23/2014, 12/18/2016, 12/19/2016, 02/01/2019   Moderna Sars-Covid-2 Vaccination 06/20/2019, 07/18/2019, 03/01/2020   Pneumococcal Conjugate-13 12/01/2017   Pneumococcal Polysaccharide-23 01/10/2011, 04/14/2019   Tdap 01/21/2016    TDAP status: Up to date  Flu Vaccine status: Declined, Education has been provided regarding the importance of this vaccine but patient  still declined. Advised may receive this vaccine at local pharmacy or Health Dept. Aware to provide a copy of the vaccination record if obtained from local pharmacy or Health Dept. Verbalized acceptance and understanding.  Pneumococcal vaccine status: Up to date  Covid-19 vaccine status: Completed vaccines  Qualifies for Shingles Vaccine? Yes   Zostavax completed No   Shingrix Completed?: No.    Education has been provided regarding the importance of this vaccine. Patient has been advised to call insurance company to determine out of pocket expense if they have not yet received this vaccine. Advised may also receive vaccine at local pharmacy or Health Dept. Verbalized acceptance and understanding.  Screening Tests Health Maintenance  Topic Date Due   COVID-19 Vaccine (4 - 2023-24 season) 11/15/2021   Zoster Vaccines- Shingrix (1 of 2) 08/13/2022 (Originally 11/29/1969)   Hepatitis C Screening  11/13/2022 (Originally 11/29/1968)   INFLUENZA VACCINE  10/16/2022   COLON CANCER SCREENING ANNUAL FOBT  05/29/2023   Medicare Annual Wellness (AWV)  08/06/2023   MAMMOGRAM  08/16/2023   DTaP/Tdap/Td (2 - Td or Tdap) 01/20/2026   Pneumonia Vaccine 10+ Years old  Completed   DEXA SCAN  Completed   HPV VACCINES  Aged Out   COLONOSCOPY (Pts 45-73yrs Insurance coverage will need to be confirmed)  Discontinued    Health Maintenance  Health Maintenance Due  Topic Date Due   COVID-19 Vaccine (4 - 2023-24 season) 11/15/2021    Colorectal cancer screening: Type of screening: FOBT/FIT. Completed 05/29/22. Repeat every 1 years  Mammogram status: Completed scheduled 08/19/22. Repeat every year  Bone Density status: Completed 07/10/21. Results reflect: Bone density results: OSTEOPENIA. Repeat every 5 years.  Lung Cancer Screening: (Low Dose CT Chest recommended if Age 17-80 years, 30 pack-year currently smoking OR have quit w/in 15years.) does not qualify.   Additional Screening:  Hepatitis C  Screening: does qualify; Completed no  Vision Screening: Recommended annual ophthalmology exams for early detection of glaucoma and other disorders of the eye. Is the patient up to date with their annual eye exam?  Yes  Who is the provider or what is the name of the office in which the patient attends annual eye exams? Dr.Shade If pt is not established with a provider, would they like to be referred to a provider to establish care? No .   Dental Screening: Recommended annual dental exams for proper oral hygiene  Community Resource Referral / Chronic Care Management: CRR required this visit?  No   CCM required this visit?  No      Plan:     I have personally reviewed and noted the following in the patient's chart:   Medical and social history Use of alcohol, tobacco or illicit drugs  Current medications and supplements including opioid prescriptions. Patient is not currently taking opioid prescriptions. Functional ability and status Nutritional status Physical activity Advanced directives List of other physicians Hospitalizations, surgeries, and ER visits in previous 12 months Vitals Screenings to include cognitive, depression, and falls Referrals and appointments  In addition, I have reviewed and discussed with patient certain preventive protocols, quality metrics, and best practice recommendations. A written personalized care plan for preventive services as well as general preventive health recommendations were provided to patient.     Hal Hope, LPN   [id number]   Nurse Notes: none

## 2022-08-06 NOTE — Patient Instructions (Signed)
Vanessa Alvarado , Thank you for taking time to come for your Medicare Wellness Visit. I appreciate your ongoing commitment to your health goals. Please review the following plan we discussed and let me know if I can assist you in the future.   These are the goals we discussed:  Goals      DIET - EAT MORE FRUITS AND VEGETABLES     Increase physical activity     Recommend increasing physical activity to at least 3 days per week         This is a list of the screening recommended for you and due dates:  Health Maintenance  Topic Date Due   COVID-19 Vaccine (4 - 2023-24 season) 11/15/2021   Zoster (Shingles) Vaccine (1 of 2) 08/13/2022*   Hepatitis C Screening: USPSTF Recommendation to screen - Ages 18-79 yo.  11/13/2022*   Flu Shot  10/16/2022   Stool Blood Test  05/29/2023   Medicare Annual Wellness Visit  08/06/2023   Mammogram  08/16/2023   DTaP/Tdap/Td vaccine (2 - Td or Tdap) 01/20/2026   Pneumonia Vaccine  Completed   DEXA scan (bone density measurement)  Completed   HPV Vaccine  Aged Out   Colon Cancer Screening  Discontinued  *Topic was postponed. The date shown is not the original due date.    Advanced directives: no  Conditions/risks identified: none  Next appointment: Follow up in one year for your annual wellness visit 08/12/23 @ 8:15 am by phone   Preventive Care 65 Years and Older, Female Preventive care refers to lifestyle choices and visits with your health care provider that can promote health and wellness. What does preventive care include? A yearly physical exam. This is also called an annual well check. Dental exams once or twice a year. Routine eye exams. Ask your health care provider how often you should have your eyes checked. Personal lifestyle choices, including: Daily care of your teeth and gums. Regular physical activity. Eating a healthy diet. Avoiding tobacco and drug use. Limiting alcohol use. Practicing safe sex. Taking low-dose aspirin every  day. Taking vitamin and mineral supplements as recommended by your health care provider. What happens during an annual well check? The services and screenings done by your health care provider during your annual well check will depend on your age, overall health, lifestyle risk factors, and family history of disease. Counseling  Your health care provider may ask you questions about your: Alcohol use. Tobacco use. Drug use. Emotional well-being. Home and relationship well-being. Sexual activity. Eating habits. History of falls. Memory and ability to understand (cognition). Work and work Astronomer. Reproductive health. Screening  You may have the following tests or measurements: Height, weight, and BMI. Blood pressure. Lipid and cholesterol levels. These may be checked every 5 years, or more frequently if you are over 19 years old. Skin check. Lung cancer screening. You may have this screening every year starting at age 38 if you have a 30-pack-year history of smoking and currently smoke or have quit within the past 15 years. Fecal occult blood test (FOBT) of the stool. You may have this test every year starting at age 63. Flexible sigmoidoscopy or colonoscopy. You may have a sigmoidoscopy every 5 years or a colonoscopy every 10 years starting at age 71. Hepatitis C blood test. Hepatitis B blood test. Sexually transmitted disease (STD) testing. Diabetes screening. This is done by checking your blood sugar (glucose) after you have not eaten for a while (fasting). You may have this  done every 1-3 years. Bone density scan. This is done to screen for osteoporosis. You may have this done starting at age 47. Mammogram. This may be done every 1-2 years. Talk to your health care provider about how often you should have regular mammograms. Talk with your health care provider about your test results, treatment options, and if necessary, the need for more tests. Vaccines  Your health care  provider may recommend certain vaccines, such as: Influenza vaccine. This is recommended every year. Tetanus, diphtheria, and acellular pertussis (Tdap, Td) vaccine. You may need a Td booster every 10 years. Zoster vaccine. You may need this after age 65. Pneumococcal 13-valent conjugate (PCV13) vaccine. One dose is recommended after age 35. Pneumococcal polysaccharide (PPSV23) vaccine. One dose is recommended after age 78. Talk to your health care provider about which screenings and vaccines you need and how often you need them. This information is not intended to replace advice given to you by your health care provider. Make sure you discuss any questions you have with your health care provider. Document Released: 03/30/2015 Document Revised: 11/21/2015 Document Reviewed: 01/02/2015 Elsevier Interactive Patient Education  2017 [employer].  Fall Prevention in the Home Falls can cause injuries. They can happen to people of all ages. There are many things you can do to make your home safe and to help prevent falls. What can I do on the outside of my home? Regularly fix the edges of walkways and driveways and fix any cracks. Remove anything that might make you trip as you walk through a door, such as a raised step or threshold. Trim any bushes or trees on the path to your home. Use bright outdoor lighting. Clear any walking paths of anything that might make someone trip, such as rocks or tools. Regularly check to see if handrails are loose or broken. Make sure that both sides of any steps have handrails. Any raised decks and porches should have guardrails on the edges. Have any leaves, snow, or ice cleared regularly. Use sand or salt on walking paths during winter. Clean up any spills in your garage right away. This includes oil or grease spills. What can I do in the bathroom? Use night lights. Install grab bars by the toilet and in the tub and shower. Do not use towel bars as grab  bars. Use non-skid mats or decals in the tub or shower. If you need to sit down in the shower, use a plastic, non-slip stool. Keep the floor dry. Clean up any water that spills on the floor as soon as it happens. Remove soap buildup in the tub or shower regularly. Attach bath mats securely with double-sided non-slip rug tape. Do not have throw rugs and other things on the floor that can make you trip. What can I do in the bedroom? Use night lights. Make sure that you have a light by your bed that is easy to reach. Do not use any sheets or blankets that are too big for your bed. They should not hang down onto the floor. Have a firm chair that has side arms. You can use this for support while you get dressed. Do not have throw rugs and other things on the floor that can make you trip. What can I do in the kitchen? Clean up any spills right away. Avoid walking on wet floors. Keep items that you use a lot in easy-to-reach places. If you need to reach something above you, use a strong step stool that  has a grab bar. Keep electrical cords out of the way. Do not use floor polish or wax that makes floors slippery. If you must use wax, use non-skid floor wax. Do not have throw rugs and other things on the floor that can make you trip. What can I do with my stairs? Do not leave any items on the stairs. Make sure that there are handrails on both sides of the stairs and use them. Fix handrails that are broken or loose. Make sure that handrails are as long as the stairways. Check any carpeting to make sure that it is firmly attached to the stairs. Fix any carpet that is loose or worn. Avoid having throw rugs at the top or bottom of the stairs. If you do have throw rugs, attach them to the floor with carpet tape. Make sure that you have a light switch at the top of the stairs and the bottom of the stairs. If you do not have them, ask someone to add them for you. What else can I do to help prevent  falls? Wear shoes that: Do not have high heels. Have rubber bottoms. Are comfortable and fit you well. Are closed at the toe. Do not wear sandals. If you use a stepladder: Make sure that it is fully opened. Do not climb a closed stepladder. Make sure that both sides of the stepladder are locked into place. Ask someone to hold it for you, if possible. Clearly mark and make sure that you can see: Any grab bars or handrails. First and last steps. Where the edge of each step is. Use tools that help you move around (mobility aids) if they are needed. These include: Canes. Walkers. Scooters. Crutches. Turn on the lights when you go into a dark area. Replace any light bulbs as soon as they burn out. Set up your furniture so you have a clear path. Avoid moving your furniture around. If any of your floors are uneven, fix them. If there are any pets around you, be aware of where they are. Review your medicines with your doctor. Some medicines can make you feel dizzy. This can increase your chance of falling. Ask your doctor what other things that you can do to help prevent falls. This information is not intended to replace advice given to you by your health care provider. Make sure you discuss any questions you have with your health care provider. Document Released: 12/28/2008 Document Revised: 08/09/2015 Document Reviewed: 04/07/2014 Elsevier Interactive Patient Education  2017 [employer].

## 2022-08-19 ENCOUNTER — Ambulatory Visit
Admission: RE | Admit: 2022-08-19 | Discharge: 2022-08-19 | Disposition: A | Discharge status: Home / Self Care | Payer: Medicare PPO | Source: Ambulatory Visit | Attending: Family Medicine | Admitting: Family Medicine

## 2022-08-19 DIAGNOSIS — Z1231 Encounter for screening mammogram for malignant neoplasm of breast: Secondary | ICD-10-CM | POA: Insufficient documentation

## 2022-11-13 ENCOUNTER — Ambulatory Visit: Payer: Medicare PPO | Admitting: Family Medicine

## 2022-11-13 ENCOUNTER — Encounter: Payer: Self-pay | Admitting: Family Medicine

## 2022-11-13 VITALS — BP 124/78 | HR 84 | Ht 64.0 in | Wt 198.0 lb

## 2022-11-13 DIAGNOSIS — I10 Essential (primary) hypertension: Secondary | ICD-10-CM | POA: Diagnosis not present

## 2022-11-13 DIAGNOSIS — E782 Mixed hyperlipidemia: Secondary | ICD-10-CM

## 2022-11-13 DIAGNOSIS — R7303 Prediabetes: Secondary | ICD-10-CM | POA: Diagnosis not present

## 2022-11-13 MED ORDER — AMLODIPINE BESY-BENAZEPRIL HCL 5-10 MG PO CAPS: 1.0000 | ORAL_CAPSULE | Freq: Every day | ORAL | 1 refills | Status: DC

## 2022-11-13 MED ORDER — HYDROCHLOROTHIAZIDE 12.5 MG PO TABS: 12.5000 mg | ORAL_TABLET | Freq: Every day | ORAL | 1 refills | Status: DC

## 2022-11-13 NOTE — Progress Notes (Signed)
Date:  11/13/2022   Name:  Vanessa Alvarado   DOB:  10/06/1950   MRN:  [id number]   Chief Complaint: Hypertension, Prediabetes, and Hyperlipidemia  Hypertension This is a chronic problem. The current episode started more than 1 year ago. The problem has been gradually improving since onset. The problem is controlled. Pertinent negatives include no anxiety, blurred vision, chest pain, headaches, orthopnea, palpitations, PND or shortness of breath. There are no associated agents to hypertension. Risk factors for coronary artery disease include diabetes mellitus and dyslipidemia. Past treatments include diuretics and calcium channel blockers. The current treatment provides moderate improvement. There are no compliance problems.  There is no history of angina, CAD/MI or CVA. There is no history of chronic renal disease, a hypertension causing med or renovascular disease.  Hyperlipidemia This is a chronic problem. The current episode started more than 1 year ago. The problem is controlled. Recent lipid tests were reviewed and are normal. Exacerbating diseases include diabetes. She has no history of chronic renal disease, hypothyroidism or nephrotic syndrome. prediabetes. Pertinent negatives include no chest pain, myalgias or shortness of breath. Current antihyperlipidemic treatment includes statins. The current treatment provides mild improvement of lipids. There are no compliance problems.  Risk factors for coronary artery disease include dyslipidemia and hypertension.  Diabetes She presents for her follow-up diabetic visit. Diabetes type: prediabetes. Pertinent negatives for hypoglycemia include no dizziness, headaches or speech difficulty. Pertinent negatives for diabetes include no blurred vision, no chest pain, no fatigue, no polydipsia and no polyuria. There are no hypoglycemic complications. Pertinent negatives for diabetic complications include no CVA. Current diabetic treatment includes diet. She  is compliant with treatment most of the time. She is following a generally healthy diet. Meal planning includes avoidance of concentrated sweets and carbohydrate counting. An ACE inhibitor/angiotensin II receptor blocker is being taken.    Lab Results  Component Value Date   NA 139 05/15/2022   K 4.2 05/15/2022   CO2 26 05/15/2022   GLUCOSE 110 (H) 05/15/2022   BUN 16 05/15/2022   CREATININE 0.90 05/15/2022   CALCIUM 10.6 (H) 05/15/2022   EGFR 69 05/15/2022   GFRNONAA 64 04/16/2020   Lab Results  Component Value Date   CHOL 222 (H) 05/15/2022   HDL 57 05/15/2022   LDLCALC 124 (H) 05/15/2022   TRIG 234 (H) 05/15/2022   CHOLHDL 3.9 09/03/2018   No results found for: "TSH" Lab Results  Component Value Date   HGBA1C 6.0 (H) 05/15/2022   No results found for: "WBC", "HGB", "HCT", "MCV", "PLT" Lab Results  Component Value Date   ALT 22 11/12/2021   AST 23 11/12/2021   ALKPHOS 83 11/12/2021   BILITOT 0.4 11/12/2021   No results found for: "25OHVITD2", "25OHVITD3", "VD25OH"   Review of Systems  Constitutional:  Negative for fatigue and fever.  HENT:  Negative for drooling and ear pain.   Eyes:  Negative for blurred vision and redness.  Respiratory:  Negative for cough, choking, chest tightness, shortness of breath and wheezing.   Cardiovascular:  Negative for chest pain, palpitations, orthopnea, leg swelling and PND.  Gastrointestinal:  Negative for anal bleeding, blood in stool, constipation and nausea.  Endocrine: Negative for polydipsia and polyuria.  Genitourinary:  Negative for dysuria, flank pain and frequency.  Musculoskeletal:  Negative for myalgias.  Neurological:  Negative for dizziness, speech difficulty and headaches.  Hematological:  Negative for adenopathy. Does not bruise/bleed easily.    Patient Active Problem List   Diagnosis  Date Noted   Hyperglycemia 12/17/2020   Mixed hyperlipidemia 04/14/2019   Psoriasis 08/28/2014   Allergic rhinitis, seasonal  08/28/2014   BP (high blood pressure) 08/25/2014   Essential (primary) hypertension 09/05/2013   Pleurisy 09/05/2013    No Known Allergies  Past Surgical History:  Procedure Laterality Date   herniated disc     lower back   OVARIAN CYST REMOVAL      Social History   Tobacco Use   Smoking status: Former   Smokeless tobacco: Never  Vaping Use   Vaping status: Never Used  Substance Use Topics   Alcohol use: Not Currently   Drug use: No     Medication list has been reviewed and updated.  Current Meds  Medication Sig   amLODipine-benazepril (LOTREL) 5-10 MG capsule Take 1 capsule by mouth daily.   calcium-vitamin D (OSCAL WITH D) 500-200 MG-UNIT tablet Take 1 tablet by mouth daily with breakfast.   cetirizine (ZYRTEC) 10 MG tablet Take 10 mg by mouth daily.   hydrochlorothiazide (HYDRODIURIL) 12.5 MG tablet Take 1 tablet (12.5 mg total) by mouth daily.       11/13/2022    9:38 AM 05/15/2022    9:31 AM 11/12/2021   10:08 AM 04/18/2021    9:13 AM  GAD 7 : Generalized Anxiety Score  Nervous, Anxious, on Edge 0 0 0 0  Control/stop worrying 0 0 0 0  Worry too much - different things 0 0 0 0  Trouble relaxing 0 0 0 0  Restless 0 0 0 0  Easily annoyed or irritable 0 0 0 0  Afraid - awful might happen 0 0 0 0  Total GAD 7 Score 0 0 0 0  Anxiety Difficulty Not difficult at all Not difficult at all Not difficult at all Not difficult at all       11/13/2022    9:38 AM 08/06/2022    8:35 AM 05/15/2022    9:31 AM  Depression screen PHQ 2/9  Decreased Interest 0 0 0  Down, Depressed, Hopeless 0 0 0  PHQ - 2 Score 0 0 0  Altered sleeping 0 0 0  Tired, decreased energy 0 0 0  Change in appetite 0 0 0  Feeling bad or failure about yourself  0 0 0  Trouble concentrating 0 0 0  Moving slowly or fidgety/restless 0 0 0  Suicidal thoughts 0 0 0  PHQ-9 Score 0 0 0  Difficult doing work/chores Not difficult at all Not difficult at all Not difficult at all    BP Readings from  Last 3 Encounters:  11/13/22 124/78  05/15/22 129/76  11/12/21 130/80    Physical Exam Vitals and nursing note reviewed. Exam conducted with a chaperone present.  Constitutional:      General: She is not in acute distress.    Appearance: She is not diaphoretic.  HENT:     Head: Normocephalic and atraumatic.     Right Ear: Tympanic membrane and external ear normal.     Left Ear: Tympanic membrane and external ear normal.     Nose: Nose normal. No congestion or rhinorrhea.     Mouth/Throat:     Pharynx: No oropharyngeal exudate or posterior oropharyngeal erythema.  Eyes:     General:        Right eye: No discharge.        Left eye: No discharge.     Conjunctiva/sclera: Conjunctivae normal.     Pupils: Pupils are equal, round,  and reactive to light.  Neck:     Thyroid: No thyromegaly.     Vascular: No JVD.  Cardiovascular:     Rate and Rhythm: Normal rate and regular rhythm.     Heart sounds: Normal heart sounds. No murmur heard.    No friction rub. No gallop.  Pulmonary:     Effort: Pulmonary effort is normal.     Breath sounds: Normal breath sounds. No wheezing, rhonchi or rales.  Chest:     Chest wall: No tenderness.  Abdominal:     General: Bowel sounds are normal.     Palpations: Abdomen is soft. There is no mass.     Tenderness: There is no abdominal tenderness. There is no guarding or rebound.  Musculoskeletal:        General: Normal range of motion.     Cervical back: Normal range of motion and neck supple.  Lymphadenopathy:     Cervical: No cervical adenopathy.  Skin:    General: Skin is warm and dry.     Capillary Refill: Capillary refill takes less than 2 seconds.  Neurological:     Mental Status: She is alert.     Wt Readings from Last 3 Encounters:  11/13/22 198 lb (89.8 kg)  08/06/22 201 lb (91.2 kg)  05/15/22 201 lb (91.2 kg)    BP 124/78   Pulse 84   Ht 5\' 4"  (1.626 m)   Wt 198 lb (89.8 kg)   SpO2 96%   BMI 33.99 kg/m   Assessment and  Plan:  1. Essential (primary) hypertension Chronic.  Controlled.  Stable.  Blood pressure 124/78.  Asymptomatic.  Tolerating medications well.  Continue amlodipine benazepril 5-10 mg once a day and hydrochlorothiazide 12.5 mg daily.  Will check CMP for electrolytes and GFR. - amLODipine-benazepril (LOTREL) 5-10 MG capsule; Take 1 capsule by mouth daily.  Dispense: 90 capsule; Refill: 1 - hydrochlorothiazide (HYDRODIURIL) 12.5 MG tablet; Take 1 tablet (12.5 mg total) by mouth daily.  Dispense: 90 tablet; Refill: 1 - Comprehensive Metabolic Panel (CMET)  2. Prediabetes Chronic.  Controlled.  Stable.  Patient is diet control and is recently returned from the beach that this may be a stress challenge to her dietary approach to prediabetes we will check A1c to see what her current status is. - HgB A1c - Lipid Panel With LDL/HDL Ratio - Comprehensive Metabolic Panel (CMET)  3. Mixed hyperlipidemia Chronic.  Controlled.  Stable.  Patient has also tried to do diet with this as well but most recently had elevated triglycerides and LDL to 124.  Patient is reluctant to go on another medication but I have told her that we can try diet but I have a feeling that medication involvement is imminent.  Will check lipid panel to see what current status of lipid management. - Lipid Panel With LDL/HDL Ratio    Elizabeth Sauer, MD

## 2022-11-13 NOTE — Patient Instructions (Signed)

## 2022-11-14 ENCOUNTER — Other Ambulatory Visit: Payer: Self-pay

## 2022-11-14 DIAGNOSIS — E782 Mixed hyperlipidemia: Secondary | ICD-10-CM

## 2022-11-14 LAB — LIPID PANEL WITH LDL/HDL RATIO
Cholesterol, Total: 221 mg/dL — ABNORMAL HIGH (ref 100–199)
HDL: 56 mg/dL (ref >39)
LDL Chol Calc (NIH): 131 mg/dL — ABNORMAL HIGH (ref 0–99)
LDL/HDL Ratio: 2.3 ratio (ref 0.0–3.2)
Triglycerides: 195 mg/dL — ABNORMAL HIGH (ref 0–149)
VLDL Cholesterol Cal: 34 mg/dL (ref 5–40)

## 2022-11-14 LAB — COMPREHENSIVE METABOLIC PANEL
ALT: 18 IU/L (ref 0–32)
AST: 19 IU/L (ref 0–40)
Albumin: 4.5 g/dL (ref 3.9–4.9)
Alkaline Phosphatase: 80 IU/L (ref 44–121)
BUN/Creatinine Ratio: 16 (ref 12–28)
BUN: 15 mg/dL (ref 8–27)
Bilirubin Total: 0.4 mg/dL (ref 0.0–1.2)
CO2: 24 mmol/L (ref 20–29)
Calcium: 10.6 mg/dL — ABNORMAL HIGH (ref 8.7–10.3)
Chloride: 97 mmol/L (ref 96–106)
Creatinine, Ser: 0.94 mg/dL (ref 0.57–1.00)
Globulin, Total: 2.8 g/dL (ref 1.5–4.5)
Glucose: 104 mg/dL — ABNORMAL HIGH (ref 70–99)
Potassium: 4.3 mmol/L (ref 3.5–5.2)
Sodium: 139 mmol/L (ref 134–144)
Total Protein: 7.3 g/dL (ref 6.0–8.5)
eGFR: 66 mL/min/{1.73_m2} (ref >59)

## 2022-11-14 LAB — HEMOGLOBIN A1C
Est. average glucose Bld gHb Est-mCnc: 126 mg/dL
Hgb A1c MFr Bld: 6 % — ABNORMAL HIGH (ref 4.8–5.6)

## 2022-11-14 MED ORDER — ATORVASTATIN CALCIUM 10 MG PO TABS
10.0000 mg | ORAL_TABLET | Freq: Every day | ORAL | 1 refills | Status: DC
Start: 2022-11-14 — End: 2023-01-14

## 2022-11-14 NOTE — Progress Notes (Signed)
Sent in lipitor ?

## 2022-12-05 DIAGNOSIS — R7303 Prediabetes: Secondary | ICD-10-CM | POA: Diagnosis not present

## 2022-12-05 DIAGNOSIS — E669 Obesity, unspecified: Secondary | ICD-10-CM | POA: Diagnosis not present

## 2022-12-05 DIAGNOSIS — H269 Unspecified cataract: Secondary | ICD-10-CM | POA: Diagnosis not present

## 2022-12-05 DIAGNOSIS — E785 Hyperlipidemia, unspecified: Secondary | ICD-10-CM | POA: Diagnosis not present

## 2022-12-05 DIAGNOSIS — I2510 Atherosclerotic heart disease of native coronary artery without angina pectoris: Secondary | ICD-10-CM | POA: Diagnosis not present

## 2022-12-05 DIAGNOSIS — I129 Hypertensive chronic kidney disease with stage 1 through stage 4 chronic kidney disease, or unspecified chronic kidney disease: Secondary | ICD-10-CM | POA: Diagnosis not present

## 2022-12-05 DIAGNOSIS — N182 Chronic kidney disease, stage 2 (mild): Secondary | ICD-10-CM | POA: Diagnosis not present

## 2022-12-05 DIAGNOSIS — R739 Hyperglycemia, unspecified: Secondary | ICD-10-CM | POA: Diagnosis not present

## 2022-12-05 DIAGNOSIS — J301 Allergic rhinitis due to pollen: Secondary | ICD-10-CM | POA: Diagnosis not present

## 2023-01-09 ENCOUNTER — Other Ambulatory Visit: Payer: Self-pay

## 2023-01-09 DIAGNOSIS — E782 Mixed hyperlipidemia: Secondary | ICD-10-CM | POA: Diagnosis not present

## 2023-01-10 ENCOUNTER — Encounter: Payer: Self-pay | Admitting: Family Medicine

## 2023-01-10 LAB — LIPID PANEL WITH LDL/HDL RATIO
Cholesterol, Total: 146 mg/dL (ref 100–199)
HDL: 56 mg/dL (ref >39)
LDL Chol Calc (NIH): 65 mg/dL (ref 0–99)
LDL/HDL Ratio: 1.2 ratio (ref 0.0–3.2)
Triglycerides: 146 mg/dL (ref 0–149)
VLDL Cholesterol Cal: 25 mg/dL (ref 5–40)

## 2023-01-14 ENCOUNTER — Other Ambulatory Visit: Payer: Self-pay

## 2023-01-14 DIAGNOSIS — E782 Mixed hyperlipidemia: Secondary | ICD-10-CM

## 2023-01-14 MED ORDER — ATORVASTATIN CALCIUM 10 MG PO TABS
10.0000 mg | ORAL_TABLET | Freq: Every day | ORAL | 1 refills | Status: DC
Start: 2023-01-14 — End: 2023-03-08

## 2023-03-07 ENCOUNTER — Other Ambulatory Visit: Payer: Self-pay | Admitting: Family Medicine

## 2023-03-07 DIAGNOSIS — E782 Mixed hyperlipidemia: Secondary | ICD-10-CM

## 2023-04-11 ENCOUNTER — Other Ambulatory Visit: Payer: Self-pay | Admitting: Family Medicine

## 2023-04-11 DIAGNOSIS — E782 Mixed hyperlipidemia: Secondary | ICD-10-CM

## 2023-04-28 ENCOUNTER — Encounter: Payer: Self-pay | Admitting: Family Medicine

## 2023-04-28 ENCOUNTER — Ambulatory Visit: Payer: Medicare PPO | Admitting: Family Medicine

## 2023-04-28 VITALS — BP 102/76 | HR 84 | Ht 64.0 in | Wt 192.0 lb

## 2023-04-28 DIAGNOSIS — E782 Mixed hyperlipidemia: Secondary | ICD-10-CM

## 2023-04-28 DIAGNOSIS — I10 Essential (primary) hypertension: Secondary | ICD-10-CM | POA: Diagnosis not present

## 2023-04-28 DIAGNOSIS — R7303 Prediabetes: Secondary | ICD-10-CM | POA: Diagnosis not present

## 2023-04-28 MED ORDER — ATORVASTATIN CALCIUM 10 MG PO TABS
10.0000 mg | ORAL_TABLET | Freq: Every day | ORAL | 1 refills | Status: DC
Start: 2023-04-28 — End: 2023-11-11

## 2023-04-28 MED ORDER — AMLODIPINE BESY-BENAZEPRIL HCL 5-10 MG PO CAPS
1.0000 | ORAL_CAPSULE | Freq: Every day | ORAL | 1 refills | Status: DC
Start: 2023-04-28 — End: 2024-01-15

## 2023-04-28 MED ORDER — HYDROCHLOROTHIAZIDE 12.5 MG PO TABS
12.5000 mg | ORAL_TABLET | Freq: Every day | ORAL | 1 refills | Status: DC
Start: 2023-04-28 — End: 2024-01-15

## 2023-04-28 NOTE — Progress Notes (Signed)
Date:  04/28/2023   Name:  Vanessa Alvarado   DOB:  09-07-1950   MRN:  [id number]   Chief Complaint: Hypertension and Hyperlipidemia  Hypertension This is a chronic problem. The current episode started more than 1 year ago. The problem has been gradually improving since onset. The problem is controlled. Pertinent negatives include no anxiety, blurred vision, chest pain, headaches, malaise/fatigue, neck pain, orthopnea, palpitations, peripheral edema, PND, shortness of breath or sweats. There are no associated agents to hypertension. There are no known risk factors for coronary artery disease. Past treatments include calcium channel blockers and diuretics. The current treatment provides moderate improvement. There are no compliance problems.  There is no history of CAD/MI or CVA. There is no history of chronic renal disease, a hypertension causing med or renovascular disease.  Hyperlipidemia This is a chronic problem. The current episode started more than 1 year ago. The problem is controlled. She has no history of chronic renal disease, diabetes, hypothyroidism, liver disease, obesity or nephrotic syndrome. Pertinent negatives include no chest pain or shortness of breath. Current antihyperlipidemic treatment includes statins. The current treatment provides moderate improvement of lipids. There are no compliance problems.  Risk factors for coronary artery disease include dyslipidemia.  Diabetes She presents for her follow-up diabetic visit. Diabetes type: prediabetes. Her disease course has been stable. There are no hypoglycemic associated symptoms. Pertinent negatives for hypoglycemia include no dizziness, headaches, nervousness/anxiousness or sweats. Pertinent negatives for diabetes include no blurred vision, no chest pain, no fatigue, no foot paresthesias, no foot ulcerations, no polydipsia and no polyuria. There are no hypoglycemic complications. Symptoms are stable. There are no diabetic  complications. Pertinent negatives for diabetic complications include no CVA. Risk factors for coronary artery disease include dyslipidemia and hypertension. Current diabetic treatment includes diet. Her weight is stable.    Lab Results  Component Value Date   NA 139 11/13/2022   K 4.3 11/13/2022   CO2 24 11/13/2022   GLUCOSE 104 (H) 11/13/2022   BUN 15 11/13/2022   CREATININE 0.94 11/13/2022   CALCIUM 10.6 (H) 11/13/2022   EGFR 66 11/13/2022   GFRNONAA 64 04/16/2020   Lab Results  Component Value Date   CHOL 146 01/09/2023   HDL 56 01/09/2023   LDLCALC 65 01/09/2023   TRIG 146 01/09/2023   CHOLHDL 3.9 09/03/2018   No results found for: "TSH" Lab Results  Component Value Date   HGBA1C 6.0 (H) 11/13/2022   No results found for: "WBC", "HGB", "HCT", "MCV", "PLT" Lab Results  Component Value Date   ALT 18 11/13/2022   AST 19 11/13/2022   ALKPHOS 80 11/13/2022   BILITOT 0.4 11/13/2022   No results found for: "25OHVITD2", "25OHVITD3", "VD25OH"   Review of Systems  Constitutional:  Negative for chills, fatigue, malaise/fatigue and unexpected weight change.  HENT:  Negative for trouble swallowing.   Eyes:  Negative for blurred vision.  Respiratory:  Negative for cough, shortness of breath and wheezing.   Cardiovascular:  Negative for chest pain, palpitations, orthopnea, leg swelling and PND.  Gastrointestinal:  Negative for abdominal pain and blood in stool.  Endocrine: Negative for polydipsia and polyuria.  Genitourinary:  Negative for difficulty urinating, hematuria and vaginal bleeding.  Musculoskeletal:  Negative for arthralgias and neck pain.  Neurological:  Negative for dizziness and headaches.  Psychiatric/Behavioral:  The patient is not nervous/anxious.     Patient Active Problem List   Diagnosis Date Noted   Hyperglycemia 12/17/2020   Mixed hyperlipidemia 04/14/2019  Psoriasis 08/28/2014   Allergic rhinitis, seasonal 08/28/2014   BP (high blood pressure)  08/25/2014   Essential (primary) hypertension 09/05/2013   Pleurisy 09/05/2013    No Known Allergies  Past Surgical History:  Procedure Laterality Date   herniated disc     lower back   OVARIAN CYST REMOVAL      Social History   Tobacco Use   Smoking status: Former   Smokeless tobacco: Never  Vaping Use   Vaping status: Never Used  Substance Use Topics   Alcohol use: Not Currently   Drug use: No     Medication list has been reviewed and updated.  Current Meds  Medication Sig   amLODipine-benazepril (LOTREL) 5-10 MG capsule Take 1 capsule by mouth daily.   atorvastatin (LIPITOR) 10 MG tablet Take 1 tablet by mouth once daily   calcium-vitamin D (OSCAL WITH D) 500-200 MG-UNIT tablet Take 1 tablet by mouth daily with breakfast.   cetirizine (ZYRTEC) 10 MG tablet Take 10 mg by mouth daily.   hydrochlorothiazide (HYDRODIURIL) 12.5 MG tablet Take 1 tablet (12.5 mg total) by mouth daily.       11/13/2022    9:38 AM 05/15/2022    9:31 AM 11/12/2021   10:08 AM 04/18/2021    9:13 AM  GAD 7 : Generalized Anxiety Score  Nervous, Anxious, on Edge 0 0 0 0  Control/stop worrying 0 0 0 0  Worry too much - different things 0 0 0 0  Trouble relaxing 0 0 0 0  Restless 0 0 0 0  Easily annoyed or irritable 0 0 0 0  Afraid - awful might happen 0 0 0 0  Total GAD 7 Score 0 0 0 0  Anxiety Difficulty Not difficult at all Not difficult at all Not difficult at all Not difficult at all       11/13/2022    9:38 AM 08/06/2022    8:35 AM 05/15/2022    9:31 AM  Depression screen PHQ 2/9  Decreased Interest 0 0 0  Down, Depressed, Hopeless 0 0 0  PHQ - 2 Score 0 0 0  Altered sleeping 0 0 0  Tired, decreased energy 0 0 0  Change in appetite 0 0 0  Feeling bad or failure about yourself  0 0 0  Trouble concentrating 0 0 0  Moving slowly or fidgety/restless 0 0 0  Suicidal thoughts 0 0 0  PHQ-9 Score 0 0 0  Difficult doing work/chores Not difficult at all Not difficult at all Not  difficult at all    BP Readings from Last 3 Encounters:  04/28/23 102/76  11/13/22 124/78  05/15/22 129/76    Physical Exam Vitals and nursing note reviewed.  Constitutional:      General: She is not in acute distress.    Appearance: She is not diaphoretic.  HENT:     Head: Normocephalic and atraumatic.     Right Ear: Tympanic membrane, ear canal and external ear normal.     Left Ear: Tympanic membrane, ear canal and external ear normal.     Nose: Nose normal.     Mouth/Throat:     Mouth: Mucous membranes are moist.  Eyes:     General:        Right eye: No discharge.        Left eye: No discharge.     Conjunctiva/sclera: Conjunctivae normal.     Pupils: Pupils are equal, round, and reactive to light.  Neck:  Thyroid: No thyromegaly.     Vascular: No JVD.  Cardiovascular:     Rate and Rhythm: Normal rate and regular rhythm.     Heart sounds: Normal heart sounds, S1 normal and S2 normal. No murmur heard.    No systolic murmur is present.     No diastolic murmur is present.     No friction rub. No gallop. No S3 or S4 sounds.  Pulmonary:     Effort: Pulmonary effort is normal.     Breath sounds: Normal breath sounds. No wheezing, rhonchi or rales.  Abdominal:     General: Bowel sounds are normal.     Palpations: Abdomen is soft. There is no mass.     Tenderness: There is no abdominal tenderness. There is no guarding or rebound.  Musculoskeletal:        General: Normal range of motion.     Cervical back: Normal range of motion and neck supple.     Left lower leg: No edema.  Lymphadenopathy:     Cervical: No cervical adenopathy.  Skin:    General: Skin is warm and dry.  Neurological:     Mental Status: She is alert.     Deep Tendon Reflexes: Reflexes are normal and symmetric.     Wt Readings from Last 3 Encounters:  04/28/23 192 lb (87.1 kg)  11/13/22 198 lb (89.8 kg)  08/06/22 201 lb (91.2 kg)    BP 102/76   Pulse 84   Ht 5\' 4"  (1.626 m)   Wt 192 lb  (87.1 kg)   SpO2 95%   BMI 32.96 kg/m   Assessment and Plan: 1. Essential (primary) hypertension (Primary) Chronic.  Controlled.  Stable.  Blood pressure today is 102/76.  Asymptomatic.  Tolerating medications well.  Continue amlodipine benazepril combination 5-10 mg once a day will recheck check renal function panel for electrolytes and GFR.  Will recheck in 6 months. - amLODipine-benazepril (LOTREL) 5-10 MG capsule; Take 1 capsule by mouth daily.  Dispense: 90 capsule; Refill: 1 - hydrochlorothiazide (HYDRODIURIL) 12.5 MG tablet; Take 1 tablet (12.5 mg total) by mouth daily.  Dispense: 90 tablet; Refill: 1 - Renal Function Panel  2. Mixed hyperlipidemia Chronic.  Controlled.  Stable.  Tolerating medication well.  Asymptomatic without myalgias or muscle weakness.  Continue atorvastatin 10 mg once a day.  Will check lipid panel for current level of LDL control.  Will recheck patient in 6 months. - atorvastatin (LIPITOR) 10 MG tablet; Take 1 tablet (10 mg total) by mouth daily.  Dispense: 90 tablet; Refill: 1 - Lipid Panel With LDL/HDL Ratio  3. Prediabetes Chronic.  Controlled.  Stable.  Currently diet controlled with limiting concentrated sweets and monitoring carbohydrate intake.  Will check A1c for current level of control.  Will recheck patient in 6 months. - Hemoglobin A1c     Elizabeth Sauer, MD

## 2023-04-28 NOTE — Patient Instructions (Signed)

## 2023-04-29 ENCOUNTER — Encounter: Payer: Self-pay | Admitting: Family Medicine

## 2023-04-29 LAB — RENAL FUNCTION PANEL
Albumin: 4.8 g/dL (ref 3.9–4.9)
BUN/Creatinine Ratio: 22 (ref 12–28)
BUN: 20 mg/dL (ref 8–27)
CO2: 27 mmol/L (ref 20–29)
Calcium: 10.3 mg/dL (ref 8.7–10.3)
Chloride: 99 mmol/L (ref 96–106)
Creatinine, Ser: 0.91 mg/dL (ref 0.57–1.00)
Glucose: 109 mg/dL — ABNORMAL HIGH (ref 70–99)
Phosphorus: 3.3 mg/dL (ref 3.0–4.3)
Potassium: 4.2 mmol/L (ref 3.5–5.2)
Sodium: 141 mmol/L (ref 134–144)
eGFR: 68 mL/min/{1.73_m2} (ref >59)

## 2023-04-29 LAB — LIPID PANEL WITH LDL/HDL RATIO
Cholesterol, Total: 145 mg/dL (ref 100–199)
HDL: 55 mg/dL (ref >39)
LDL Chol Calc (NIH): 55 mg/dL (ref 0–99)
LDL/HDL Ratio: 1 {ratio} (ref 0.0–3.2)
Triglycerides: 223 mg/dL — ABNORMAL HIGH (ref 0–149)
VLDL Cholesterol Cal: 35 mg/dL (ref 5–40)

## 2023-04-29 LAB — HEMOGLOBIN A1C
Est. average glucose Bld gHb Est-mCnc: 126 mg/dL
Hgb A1c MFr Bld: 6 % — ABNORMAL HIGH (ref 4.8–5.6)

## 2023-08-12 ENCOUNTER — Ambulatory Visit: Payer: Medicare PPO | Admitting: Emergency Medicine

## 2023-08-12 VITALS — Ht 64.0 in | Wt 191.0 lb

## 2023-08-12 DIAGNOSIS — Z0000 Encounter for general adult medical examination without abnormal findings: Secondary | ICD-10-CM | POA: Diagnosis not present

## 2023-08-12 DIAGNOSIS — Z1231 Encounter for screening mammogram for malignant neoplasm of breast: Secondary | ICD-10-CM

## 2023-08-12 NOTE — Patient Instructions (Addendum)
 Vanessa Alvarado , Thank you for taking time out of your busy schedule to complete your Annual Wellness Visit with me. I enjoyed our conversation and look forward to speaking with you again next year. I, as well as your care team,  appreciate your ongoing commitment to your health goals. Please review the following plan we discussed and let me know if I can assist you in the future. Your Game plan/ To Do List    Referrals: None   Follow up Visits: Next Medicare AWV with our clinical staff: 08/17/24 @ 8:10am (phone visit)   Have you seen your provider in the last 6 months (3 months if uncontrolled diabetes)? Yes Next Office Visit with your provider: 10/16/23 @ 8:00 am with Dr. Barnetta Liberty  Clinician Recommendations: I have placed an order for a mammogram. Call [phone] to schedule. Aim for 30 minutes of exercise or brisk walking, 6-8 glasses of water, and 5 servings of fruits and vegetables each day.       This is a list of the screening recommended for you and due dates:  Health Maintenance  Topic Date Due   Hepatitis C Screening  Never done   Zoster (Shingles) Vaccine (1 of 2) Never done   Stool Blood Test  05/29/2023   Mammogram  08/19/2023   COVID-19 Vaccine (5 - Moderna risk 2024-25 season) 09/01/2023   Flu Shot  10/16/2023   Medicare Annual Wellness Visit  08/11/2024   DTaP/Tdap/Td vaccine (2 - Td or Tdap) 01/20/2026   DEXA scan (bone density measurement)  07/11/2026   Pneumonia Vaccine  Completed   HPV Vaccine  Aged Out   Meningitis B Vaccine  Aged Out   Colon Cancer Screening  Discontinued    Advanced directives: (Declined) Advance directive discussed with you today. Even though you declined this today, please call our office should you change your mind, and we can give you the proper paperwork for you to fill out. Advance Care Planning is important because it:  [x]  Makes sure you receive the medical care that is consistent with your values, goals, and preferences  [x]  It  provides guidance to your family and loved ones and reduces their decisional burden about whether or not they are making the right decisions based on your wishes.  Follow the link provided in your after visit summary or read over the paperwork we have mailed to you to help you started getting your Advance Directives in place. If you need assistance in completing these, please reach out to us  so that we can help you!  See attachments for Preventive Care and Fall Prevention Tips.   Fall Prevention in the Home, Adult Falls can cause injuries and affect people of all ages. There are many simple things that you can do to make your home safe and to help prevent falls. If you need it, ask for help making these changes. What actions can I take to prevent falls? General information Use good lighting in all rooms. Make sure to: Replace any light bulbs that burn out. Turn on lights if it is dark and use night-lights. Keep items that you use often in easy-to-reach places. Lower the shelves around your home if needed. Move furniture so that there are clear paths around it. Do not keep throw rugs or other things on the floor that can make you trip. If any of your floors are uneven, fix them. Add color or contrast paint or tape to clearly mark and help you see: Grab bars  or handrails. First and last steps of staircases. Where the edge of each step is. If you use a ladder or stepladder: Make sure that it is fully opened. Do not climb a closed ladder. Make sure the sides of the ladder are locked in place. Have someone hold the ladder while you use it. Know where your pets are as you move through your home. What can I do in the bathroom?     Keep the floor dry. Clean up any water that is on the floor right away. Remove soap buildup in the bathtub or shower. Buildup makes bathtubs and showers slippery. Use non-skid mats or decals on the floor of the bathtub or shower. Attach bath mats securely with  double-sided, non-slip rug tape. If you need to sit down while you are in the shower, use a non-slip stool. Install grab bars by the toilet and in the bathtub and shower. Do not use towel bars as grab bars. What can I do in the bedroom? Make sure that you have a light by your bed that is easy to reach. Do not use any sheets or blankets on your bed that hang to the floor. Have a firm bench or chair with side arms that you can use for support when you get dressed. What can I do in the kitchen? Clean up any spills right away. If you need to reach something above you, use a sturdy step stool that has a grab bar. Keep electrical cables out of the way. Do not use floor polish or wax that makes floors slippery. What can I do with my stairs? Do not leave anything on the stairs. Make sure that you have a light switch at the top and the bottom of the stairs. Have them installed if you do not have them. Make sure that there are handrails on both sides of the stairs. Fix handrails that are broken or loose. Make sure that handrails are as long as the staircases. Install non-slip stair treads on all stairs in your home if they do not have carpet. Avoid having throw rugs at the top or bottom of stairs, or secure the rugs with carpet tape to prevent them from moving. Choose a carpet design that does not hide the edge of steps on the stairs. Make sure that carpet is firmly attached to the stairs. Fix any carpet that is loose or worn. What can I do on the outside of my home? Use bright outdoor lighting. Repair the edges of walkways and driveways and fix any cracks. Clear paths of anything that can make you trip, such as tools or rocks. Add color or contrast paint or tape to clearly mark and help you see high doorway thresholds. Trim any bushes or trees on the main path into your home. Check that handrails are securely fastened and in good repair. Both sides of all steps should have handrails. Install  guardrails along the edges of any raised decks or porches. Have leaves, snow, and ice cleared regularly. Use sand, salt, or ice melt on walkways during winter months if you live where there is ice and snow. In the garage, clean up any spills right away, including grease or oil spills. What other actions can I take? Review your medicines with your health care provider. Some medicines can make you confused or feel dizzy. This can increase your chance of falling. Wear closed-toe shoes that fit well and support your feet. Wear shoes that have rubber soles and low heels. Use  a cane, walker, scooter, or crutches that help you move around if needed. Talk with your provider about other ways that you can decrease your risk of falls. This may include seeing a physical therapist to learn to do exercises to improve movement and strength. Where to find more information Centers for Disease Control and Prevention, STEADI: TonerPromos.no [employer] on Aging: BaseRingTones.pl National Institute on Aging: BaseRingTones.pl Contact a health care provider if: You are afraid of falling at home. You feel weak, drowsy, or dizzy at home. You fall at home. Get help right away if you: Lose consciousness or have trouble moving after a fall. Have a fall that causes a head injury. These symptoms may be an emergency. Get help right away. Call 911. Do not wait to see if the symptoms will go away. Do not drive yourself to the hospital. This information is not intended to replace advice given to you by your health care provider. Make sure you discuss any questions you have with your health care provider. Document Revised: 11/04/2021 Document Reviewed: 11/04/2021 Elsevier Patient Education  2024 [employer].

## 2023-08-12 NOTE — Progress Notes (Signed)
 Subjective:   Vanessa Alvarado is a 73 y.o. who presents for a Medicare Wellness preventive visit.  As a reminder, Annual Wellness Visits don't include a physical exam, and some assessments may be limited, especially if this visit is performed virtually. We may recommend an in-person follow-up visit with your provider if needed.  Visit Complete: Virtual I connected with  Vanessa Alvarado on 08/12/23 by a audio enabled telemedicine application and verified that I am speaking with the correct person using two identifiers.  Patient Location: Home  Provider Location: Home Office  I discussed the limitations of evaluation and management by telemedicine. The patient expressed understanding and agreed to proceed.  Vital Signs: Because this visit was a virtual/telehealth visit, some criteria may be missing or patient reported. Any vitals not documented were not able to be obtained and vitals that have been documented are patient reported.  VideoDeclined- This patient declined Librarian, academic. Therefore the visit was completed with audio only.  Persons Participating in Visit: Patient.  AWV Questionnaire: No: Patient Medicare AWV questionnaire was not completed prior to this visit.  Cardiac Risk Factors include: advanced age (>9men, >31 women);dyslipidemia;hypertension;obesity (BMI >30kg/m2)     Objective:     Today's Vitals   08/12/23 0811  Weight: 191 lb (86.6 kg)  Height: 5\' 4"  (1.626 m)   Body mass index is 32.79 kg/m.     08/12/2023    8:25 AM 08/06/2022    8:36 AM 07/22/2021    8:16 AM 07/18/2020    8:30 AM 07/18/2019    8:09 AM 07/21/2016    9:01 AM 06/01/2015    9:16 AM  Advanced Directives  Does Patient Have a Medical Advance Directive? No No No No No No No  Would patient like information on creating a medical advance directive? No - Patient declined No - Patient declined No - Patient declined No - Patient declined No - Patient declined       Current Medications (verified) Outpatient Encounter Medications as of 08/12/2023  Medication Sig   amLODipine -benazepril  (LOTREL) 5-10 MG capsule Take 1 capsule by mouth daily.   atorvastatin  (LIPITOR) 10 MG tablet Take 1 tablet (10 mg total) by mouth daily.   calcium -vitamin D  (OSCAL WITH D) 500-200 MG-UNIT tablet Take 1 tablet by mouth daily with breakfast.   cetirizine (ZYRTEC) 10 MG tablet Take 10 mg by mouth daily.   hydrochlorothiazide  (HYDRODIURIL ) 12.5 MG tablet Take 1 tablet (12.5 mg total) by mouth daily.   No facility-administered encounter medications on file as of 08/12/2023.    Allergies (verified) Patient has no known allergies.   History: Past Medical History:  Diagnosis Date   Depression    Hypertension    Past Surgical History:  Procedure Laterality Date   herniated disc     lower back   OVARIAN CYST REMOVAL     Family History  Problem Relation Age of Onset   Other Mother 69       sepsis due to a bed sore   Hypertension Mother    Hyperlipidemia Father    Heart disease Father    Breast cancer Neg Hx    Social History   Socioeconomic History   Marital status: Married    Spouse name: Doroteo Gasmen   Number of children: 2   Years of education: Not on file   Highest education level: Associate degree: academic program  Occupational History   Occupation: retired  Tobacco Use   Smoking status: Former  Current packs/day: 0.00    Average packs/day: 1 pack/day for 21.1 years (21.1 ttl pk-yrs)    Types: Cigarettes    Start date: 68    Quit date: 04/1994    Years since quitting: 29.3   Smokeless tobacco: Never  Vaping Use   Vaping status: Never Used  Substance and Sexual Activity   Alcohol use: Not Currently   Drug use: No   Sexual activity: Yes  Other Topics Concern   Not on file  Social History Narrative   Not on file   Social Drivers of Health   Financial Resource Strain: Low Risk  (08/12/2023)   Overall Financial Resource Strain (CARDIA)     Difficulty of Paying Living Expenses: Not hard at all  Food Insecurity: No Food Insecurity (08/12/2023)   Hunger Vital Sign    Worried About Running Out of Food in the Last Year: Never true    Ran Out of Food in the Last Year: Never true  Transportation Needs: No Transportation Needs (08/12/2023)   PRAPARE - Administrator, Civil Service (Medical): No    Lack of Transportation (Non-Medical): No  Physical Activity: Inactive (08/12/2023)   Exercise Vital Sign    Days of Exercise per Week: 0 days    Minutes of Exercise per Session: 0 min  Stress: No Stress Concern Present (08/12/2023)   [employer] of Occupational Health - Occupational Stress Questionnaire    Feeling of Stress : Not at all  Social Connections: Moderately Integrated (08/12/2023)   Social Connection and Isolation Panel [NHANES]    Frequency of Communication with Friends and Family: Twice a week    Frequency of Social Gatherings with Friends and Family: Three times a week    Attends Religious Services: More than 4 times per year    Active Member of Clubs or Organizations: No    Attends Banker Meetings: Never    Marital Status: Married    Tobacco Counseling Counseling given: No    Clinical Intake:  Pre-visit preparation completed: Yes  Pain : No/denies pain     BMI - recorded: 32.79 Nutritional Status: BMI > 30  Obese Nutritional Risks: None Diabetes: No  Lab Results  Component Value Date   HGBA1C 6.0 (H) 04/28/2023   HGBA1C 6.0 (H) 11/13/2022   HGBA1C 6.0 (H) 05/15/2022     How often do you need to have someone help you when you read instructions, pamphlets, or other written materials from your doctor or pharmacy?: 1 - Never  Interpreter Needed?: No  Information entered by :: Jaunita Messier, CMA   Activities of Daily Living     08/12/2023    8:13 AM  In your present state of health, do you have any difficulty performing the following activities:  Hearing? 0  Vision?  0  Difficulty concentrating or making decisions? 0  Walking or climbing stairs? 0  Dressing or bathing? 0  Doing errands, shopping? 0  Preparing Food and eating ? N  Using the Toilet? N  In the past six months, have you accidently leaked urine? N  Do you have problems with loss of bowel control? N  Managing your Medications? N  Managing your Finances? N  Housekeeping or managing your Housekeeping? N    Patient Care Team: Clarise Crooks, MD as PCP - General (Family Medicine) Spencer Dy, OD (Optometry) McVey, Georges Kings, CNM as Midwife (Obstetrics and Gynecology)  Indicate any recent Medical Services you may have received from other than  [facility] providers in the past year (date may be approximate).     Assessment:    This is a routine wellness examination for Semaya.  Hearing/Vision screen Hearing Screening - Comments:: Denies hearing loss Vision Screening - Comments:: Gets routine eye exams, Dr. Spencer Dy, [facility] [place]   Goals Addressed               This Visit's Progress     Weight (lb) < 160 lb (72.6 kg) (pt-stated)   191 lb (86.6 kg)      Depression Screen     08/12/2023    8:23 AM 04/28/2023    8:05 AM 11/13/2022    9:38 AM 08/06/2022    8:35 AM 05/15/2022    9:31 AM 11/12/2021   10:08 AM 07/22/2021    8:15 AM  PHQ 2/9 Scores  PHQ - 2 Score 0 0 0 0 0 0 0  PHQ- 9 Score 0  0 0 0 0     Fall Risk     08/12/2023    8:26 AM 04/28/2023    8:05 AM 11/13/2022    9:38 AM 08/06/2022    8:37 AM 05/15/2022    9:31 AM  Fall Risk   Falls in the past year? 0 0 0 0 0  Number falls in past yr: 0 0 0 0 0  Injury with Fall? 0 0 0 0 0  Risk for fall due to : No Fall Risks No Fall Risks No Fall Risks No Fall Risks No Fall Risks  Follow up Falls evaluation completed Falls evaluation completed Falls evaluation completed Falls prevention discussed;Falls evaluation completed Falls evaluation completed    MEDICARE RISK AT HOME:  Medicare Risk at Home Any stairs in or  around the home?: Yes If so, are there any without handrails?: No Home free of loose throw rugs in walkways, pet beds, electrical cords, etc?: Yes Adequate lighting in your home to reduce risk of falls?: Yes Life alert?: No Use of a cane, walker or w/c?: No Grab bars in the bathroom?: No Shower chair or bench in shower?: No (has shower chair if needed) Elevated toilet seat or a handicapped toilet?: No  TIMED UP AND GO:  Was the test performed?  No  Cognitive Function: 6CIT completed        08/12/2023    8:28 AM 08/06/2022    8:42 AM  6CIT Screen  What Year? 0 points 0 points  What month? 0 points 0 points  What time? 0 points 0 points  Count back from 20 0 points 0 points  Months in reverse 0 points 0 points  Repeat phrase 0 points 0 points  Total Score 0 points 0 points    Immunizations Immunization History  Administered Date(s) Administered   Fluad Quad(high Dose 65+) 01/12/2020, 12/17/2020   Influenza Split 12/26/2010   Influenza, High Dose Seasonal PF 12/01/2017   Influenza-Unspecified 11/23/2014, 12/18/2016, 12/19/2016, 02/01/2019   Moderna Sars-Covid-2 Vaccination 06/20/2019, 07/18/2019, 03/01/2020   Pneumococcal Conjugate-13 12/01/2017   Pneumococcal Polysaccharide-23 01/10/2011, 04/14/2019   Tdap 01/21/2016   Unspecified SARS-COV-2 Vaccination 03/03/2023    Screening Tests Health Maintenance  Topic Date Due   Hepatitis C Screening  Never done   Zoster Vaccines- Shingrix (1 of 2) Never done   COLON CANCER SCREENING ANNUAL FOBT  05/29/2023   MAMMOGRAM  08/19/2023   COVID-19 Vaccine (5 - Moderna risk 2024-25 season) 09/01/2023   INFLUENZA VACCINE  10/16/2023   Medicare Annual Wellness (AWV)  08/11/2024   DTaP/Tdap/Td (2 - Td or Tdap) 01/20/2026   DEXA SCAN  07/11/2026   Pneumonia Vaccine 7+ Years old  Completed   HPV VACCINES  Aged Out   Meningococcal B Vaccine  Aged Out   Colonoscopy  Discontinued    Health Maintenance  Health Maintenance Due   Topic Date Due   Hepatitis C Screening  Never done   Zoster Vaccines- Shingrix (1 of 2) Never done   COLON CANCER SCREENING ANNUAL FOBT  05/29/2023   Health Maintenance Items Addressed: Mammogram ordered, See Nurse Notes  Additional Screening:  Vision Screening: Recommended annual ophthalmology exams for early detection of glaucoma and other disorders of the eye.  Dental Screening: Recommended annual dental exams for proper oral hygiene  Community Resource Referral / Chronic Care Management: CRR required this visit?  No   CCM required this visit?  No   Plan:    I have personally reviewed and noted the following in the patient's chart:   Medical and social history Use of alcohol, tobacco or illicit drugs  Current medications and supplements including opioid prescriptions. Patient is not currently taking opioid prescriptions. Functional ability and status Nutritional status Physical activity Advanced directives List of other physicians Hospitalizations, surgeries, and ER visits in previous 12 months Vitals Screenings to include cognitive, depression, and falls Referrals and appointments  In addition, I have reviewed and discussed with patient certain preventive protocols, quality metrics, and best practice recommendations. A written personalized care plan for preventive services as well as general preventive health recommendations were provided to patient.   Jaunita Messier, CMA   08/12/2023   After Visit Summary: (MyChart) Due to this being a telephonic visit, the after visit summary with patients personalized plan was offered to patient via MyChart   Notes: Please refer to Routing Comments.

## 2023-09-22 NOTE — Progress Notes (Signed)
 Routine Annual Gynecology Examination   PCP: Joshua Cathryne Rodney, MD  Chief Complaint:  Chief Complaint  Patient presents with  . Annual Exam    History of Present Illness:  Ms. Vanessa Alvarado is a 73 y.o. G2P2002 presents today for her annual examination.    Patient Concerns: - None  Pertinent Hx: - Hx of ovarian cystectomy  - Hx of cyrosurgery  - HTN - Depression  Menopausal age: early 60's Menopausal bleeding: denies Menopausal symptoms: denies  She is single partner, contraception - post menopausal status. She does not have vaginal dryness.  Breast symptoms: denies  Screening: - Last Pap: 06/26/2020  Results were: no abnormalities /neg HPV DNA.  - Last mammogram: 08/19/2022.  Results were: normal--routine follow-up in 12 months.  She is aware of how her breast look and feel. Denies any breast concerns. - Family hx of breast cancer: denies - Family hx of ovarian cancer: denies - Family hx of colon cancer: denies - Colon Cancer Screening: Fecal Occult completed on 05/29/2022 - negative - DEXA: was screened for osteoporis on 07/10/2021 - normal  Social Hx: Marital Status: married Tobacco use: The patient denies current or previous tobacco use. Alcohol use: none Exercise: not active  She does get adequate calcium  and Vitamin D  in her diet.  The patient wears seatbelts: yes. The patient reports that domestic violence in her life is absent.   PMH:  Past Medical History:  Diagnosis Date  . Allergic rhinitis due to allergen 2000   dust, pollen  . Depression 09/05/2013  . Pleurisy 09/05/2013  . Psoriasis   . Seasonal allergies   . Sinusitis, unspecified 2000   seasonal  . Unspecified essential hypertension 09/05/2013    Past Surgical History:  Procedure Laterality Date  . GYNECOLOGIC CRYOSURGERY  1970s  . herniated disc    . OOPHORECTOMY    . REMOVAL OVARIAN CYST    . SPINE SURGERY      Prior to Admission medications  Medication Sig Taking? Last  Dose  amLODIPine -benazepril  (LOTREL) 5-10 mg capsule Take by mouth. Yes Taking  atorvastatin  (LIPITOR) 10 MG tablet Take 10 mg by mouth once daily Yes Taking  calcium  carbonate-vitamin D3 (OS-CAL 500+D) 500 mg-5 mcg (200 unit) tablet Take 1 tablet by mouth daily with breakfast Yes Taking  cetirizine (ZYRTEC) 10 MG tablet Take 10 mg by mouth once daily Yes Taking  hydrochlorothiazide  (HYDRODIURIL ) 12.5 MG tablet Take by mouth. Yes Taking  erythromycin (ROMYCIN) ophthalmic ointment To incisions BID x 14 days Patient not taking: Reported on 09/25/2020      No Known Allergies  Gynecologic History: No LMP recorded. Patient is postmenopausal. Contraception: post menopausal status Pap Hx: no abnormal in 20+ years. Abnormal in her 20's s/p cryo   Obstetric History: G2P2002  Social History   Socioeconomic History  . Marital status: Married  Tobacco Use  . Smoking status: Never  . Smokeless tobacco: Never  Substance and Sexual Activity  . Alcohol use: No    Comment: once a year  . Drug use: No  . Sexual activity: Yes    Partners: Male    Birth control/protection: Post-menopausal  Other Topics Concern  . Would you please tell us  about the people who live in your home, your pets, or anything else important to your social life? No   Social Drivers of Corporate investment banker Strain: Low Risk  (09/23/2023)   Overall Financial Resource Strain (CARDIA)   . Difficulty of Paying Living Expenses: Not  hard at all  Food Insecurity: No Food Insecurity (09/23/2023)   Hunger Vital Sign   . Worried About Programme researcher, broadcasting/film/video in the Last Year: Never true   . Ran Out of Food in the Last Year: Never true  Transportation Needs: No Transportation Needs (09/23/2023)   PRAPARE - Transportation   . Lack of Transportation (Medical): No   . Lack of Transportation (Non-Medical): No  Physical Activity: Inactive (08/12/2023)   Received from [facility]   Exercise Vital Sign   . On average, how many days per  week do you engage in moderate to strenuous exercise (like a brisk walk)?: 0 days   . On average, how many minutes do you engage in exercise at this level?: 0 min  Stress: No Stress Concern Present (08/12/2023)   Received from [facility] of Occupational Health - Occupational Stress Questionnaire   . Feeling of Stress : Not at all  Social Connections: Moderately Integrated (08/12/2023)   Received from [facility]   Social Connection and Isolation Panel   . In a typical week, how many times do you talk on the phone with family, friends, or neighbors?: Twice a week   . How often do you get together with friends or relatives?: Three times a week   . How often do you attend church or religious services?: More than 4 times per year   . Do you belong to any clubs or organizations such as church groups, unions, fraternal or athletic groups, or school groups?: No   . How often do you attend meetings of the clubs or organizations you belong to?: Never   . Are you married, widowed, divorced, separated, never married, or living with a partner?: Married  Housing Stability: Unknown (09/23/2023)   Housing Stability Vital Sign   . Unable to Pay for Housing in the Last Year: No   . Homeless in the Last Year: No    Family History  Problem Relation Name Age of Onset  . High blood pressure (Hypertension) Mother Velia   . Allergic rhinitis Father Father        seasonal    ROS: see HPI for pertinent positives and negatives, otherwise a 10 system review is negative.  Specifically, she denies problems with period, abdominal pain, pelvic pain, vaginal discharge. Denies bowel problems, bladder problems, incontinence, depression or breast lumps or masses.  Physical Exam Vitals: BP 133/84   Pulse 60   Ht 160 cm (5' 3)   Wt 89.4 kg (197 lb)   BMI 34.90 kg/m    Chaperone present for pelvic exam. Examination chaperoned by N. Saligan, CMA.  Physical Exam Genitourinary:     Vulva and  rectum normal.     Pelvic Tanner Score: 5/5.    No vaginal discharge.     No vaginal prolapse present.    No vaginal atrophy present.     Right Adnexa: not tender.    Left Adnexa: not tender.    No cervical motion tenderness or discharge.     Uterus is not enlarged or tender.  Breasts:    Tanner Score is 5.     Right: Normal. No mass, nipple discharge, skin change or tenderness.     Left: Normal. No mass, nipple discharge, skin change or tenderness.  HENT:     Head: Normocephalic.     Nose: Nose normal.   Eyes:     Pupils: Pupils are equal, round, and reactive to light.  Cardiovascular:     Rate and Rhythm: Normal rate and regular rhythm.     Heart sounds: Normal heart sounds.  Pulmonary:     Effort: Pulmonary effort is normal.     Breath sounds: Normal breath sounds.  Abdominal:     General: Bowel sounds are normal.     Palpations: Abdomen is soft.     Tenderness: There is no abdominal tenderness.   Musculoskeletal:        General: Normal range of motion.     Cervical back: Normal range of motion.   Neurological:     Mental Status: She is alert and oriented to person, place, and time.   Skin:    General: Skin is warm and dry.     Capillary Refill: Capillary refill takes less than 2 seconds.   Psychiatric:        Mood and Affect: Mood normal.        Behavior: Behavior normal.        Thought Content: Thought content normal.        Judgment: Judgment normal.  Exam conducted with a chaperone present.      Results: PHQ-9: 0   Assessment and Plan:  73 y.o. G21P2002 female here for routine annual gynecologic examination  Plan: Problem List Items Addressed This Visit   None Visit Diagnoses       Encounter for gynecological examination    -  Primary     Cervical cancer screening       Relevant Orders   Pap IG (Image Guided) - [facility]     Depression screening (Z13.31)       Relevant Orders   Depression Screen -(PHQ- 2/9, BDI) (Completed)        Screening: -- Blood pressure screen normal -- Colon Cancer Screening - due - managed by PCP -- Mammogram - due - already scheduled at [facility] on 10/27/2023 -- Weight screening: obese: discussed management options, including lifestyle, dietary, and exercise. -- Depression screening negative (PHQ-9)  -- Nutrition: normal -- cholesterol screening: per PCP -- osteoporosis screening: not due -- tobacco screening: not using -- alcohol screening: AUDIT questionnaire indicates low-risk usage. -- family history of breast cancer screening: done. not at high risk. -- no evidence of domestic violence or intimate partner violence. -- STD screening: gonorrhea/chlamydia NAAT not collected per patient request. -- pap smear collected per ASCCP guidelines -- flu vaccine: Up to date.  -- HPV vaccination series: not eligilbe  I emphasized the importance of an annual pelvic and breast exam, though as always these exams are her choice.  I have discussed the importance of breast self-awareness, weight bearing exercise and healthy diet as well as adequate intake of dietary calcium  from (1000mg ) and vitamin D  (400IU).    We discussed current guidelines for cervical cancer screening. With no abnormal history in 20+ years, and being 73 y.o, she is aware that she can discontinue screening. Plan for screening today.   Return in about 1 year (around 09/22/2024) for [facility] visit.   Attestation Statement:   I personally performed the service, non-incident to. (WP)   KATHERINE PHILLIPS, NP [facility] 09/23/2023 11:33 AM

## 2023-10-10 LAB — FECAL OCCULT BLOOD, IMMUNOCHEMICAL: IFOBT: NEGATIVE

## 2023-10-16 ENCOUNTER — Ambulatory Visit: Payer: Medicare PPO | Admitting: Student

## 2023-10-16 ENCOUNTER — Encounter: Payer: Self-pay | Admitting: Student

## 2023-10-16 VITALS — BP 116/72 | HR 74 | Ht 64.0 in | Wt 195.4 lb

## 2023-10-16 DIAGNOSIS — E6609 Other obesity due to excess calories: Secondary | ICD-10-CM

## 2023-10-16 DIAGNOSIS — E781 Pure hyperglyceridemia: Secondary | ICD-10-CM | POA: Diagnosis not present

## 2023-10-16 DIAGNOSIS — Z6834 Body mass index (BMI) 34.0-34.9, adult: Secondary | ICD-10-CM | POA: Diagnosis not present

## 2023-10-16 DIAGNOSIS — I10 Essential (primary) hypertension: Secondary | ICD-10-CM

## 2023-10-16 DIAGNOSIS — R7303 Prediabetes: Secondary | ICD-10-CM | POA: Insufficient documentation

## 2023-10-16 DIAGNOSIS — J302 Other seasonal allergic rhinitis: Secondary | ICD-10-CM | POA: Diagnosis not present

## 2023-10-16 DIAGNOSIS — E669 Obesity, unspecified: Secondary | ICD-10-CM | POA: Insufficient documentation

## 2023-10-16 DIAGNOSIS — E66811 Obesity, class 1: Secondary | ICD-10-CM | POA: Diagnosis not present

## 2023-10-16 DIAGNOSIS — E782 Mixed hyperlipidemia: Secondary | ICD-10-CM | POA: Diagnosis not present

## 2023-10-16 NOTE — Assessment & Plan Note (Signed)
 She is fasting this morning. Check Lipid panel today.

## 2023-10-16 NOTE — Assessment & Plan Note (Addendum)
 Well controlled on current medications. Continue amlodipine -benazepril  5-10mg  daily and hydrochlorothiazide  12.5 mg daily. CMP today

## 2023-10-16 NOTE — Assessment & Plan Note (Signed)
 A1c was 6.0% in February.

## 2023-10-16 NOTE — Progress Notes (Signed)
 Established Patient Office Visit  Subjective   Patient ID: Vanessa Alvarado, female    DOB: 1950-12-22  Age: 73 y.o. MRN: [id number]  Chief Complaint  Patient presents with   Transitions Of Care    Patient is here to transition over from Dr. Joshua to new PCP   Medication Refill   Hypertension   Vanessa Alvarado is a 73 y.o. presents for transfer of care from prior PCP Dr. Joshua who recently retired. No acute complaints today. Please refer to problem based charting for further details and assessment and plan of current problem and chronic medical conditions.  Patient Active Problem List   Diagnosis Date Noted   Obesity 10/16/2023   Hypertriglyceridemia 10/16/2023   Prediabetes 10/16/2023   Hyperglycemia 12/17/2020   Mixed hyperlipidemia 04/14/2019   Allergic rhinitis, seasonal 08/28/2014   Essential (primary) hypertension 09/05/2013      ROS Refer to HPI    Objective:     BP 116/72   Pulse 74   Ht 5' 4 (1.626 m)   Wt 195 lb 6 oz (88.6 kg)   SpO2 95%   BMI 33.54 kg/m  BP Readings from Last 3 Encounters:  10/16/23 116/72  04/28/23 102/76  11/13/22 124/78    Physical Exam Constitutional:      Appearance: Normal appearance.  HENT:     Mouth/Throat:     Mouth: Mucous membranes are moist.     Pharynx: Oropharynx is clear.  Cardiovascular:     Rate and Rhythm: Normal rate and regular rhythm.  Pulmonary:     Effort: Pulmonary effort is normal.     Breath sounds: No rhonchi or rales.  Abdominal:     General: Abdomen is flat. Bowel sounds are normal. There is no distension.     Palpations: Abdomen is soft.     Tenderness: There is no abdominal tenderness.  Musculoskeletal:        General: Normal range of motion.     Right lower leg: No edema.     Left lower leg: No edema.  Skin:    General: Skin is warm and dry.     Capillary Refill: Capillary refill takes less than 2 seconds.  Neurological:     General: No focal deficit present.     Mental Status: She  is alert and oriented to person, place, and time.  Psychiatric:        Mood and Affect: Mood normal.        Behavior: Behavior normal.        10/16/2023    8:10 AM 08/12/2023    8:23 AM 04/28/2023    8:05 AM  Depression screen PHQ 2/9  Decreased Interest 0 0 0  Down, Depressed, Hopeless 0 0 0  PHQ - 2 Score 0 0 0  Altered sleeping 0 0   Tired, decreased energy 0 0   Change in appetite 0 0   Feeling bad or failure about yourself  0 0   Trouble concentrating 0 0   Moving slowly or fidgety/restless 0 0   Suicidal thoughts 0 0   PHQ-9 Score 0 0   Difficult doing work/chores Not difficult at all Not difficult at all        10/16/2023    8:10 AM 04/28/2023    8:05 AM 11/13/2022    9:38 AM 05/15/2022    9:31 AM  GAD 7 : Generalized Anxiety Score  Nervous, Anxious, on Edge 0 0 0 0  Control/stop worrying  0 0 0 0  Worry too much - different things 0 0 0 0  Trouble relaxing 0 0 0 0  Restless 0 0 0 0  Easily annoyed or irritable 0 0 0 0  Afraid - awful might happen 0 0 0 0  Total GAD 7 Score 0 0 0 0  Anxiety Difficulty Not difficult at all Not difficult at all Not difficult at all Not difficult at all    No results found for any visits on 10/16/23.  Last metabolic panel Lab Results  Component Value Date   GLUCOSE 109 (H) 04/28/2023   NA 141 04/28/2023   K 4.2 04/28/2023   CL 99 04/28/2023   CO2 27 04/28/2023   BUN 20 04/28/2023   CREATININE 0.91 04/28/2023   EGFR 68 04/28/2023   CALCIUM  10.3 04/28/2023   PHOS 3.3 04/28/2023   PROT 7.3 11/13/2022   ALBUMIN 4.8 04/28/2023   LABGLOB 2.8 11/13/2022   AGRATIO 1.9 11/12/2021   BILITOT 0.4 11/13/2022   ALKPHOS 80 11/13/2022   AST 19 11/13/2022   ALT 18 11/13/2022      The 10-year ASCVD risk score (Arnett DK, et al., 2019) is: 9.5%    Assessment & Plan:  Essential (primary) hypertension Assessment & Plan: Well controlled on current medications. Continue amlodipine -benazepril  5-10mg  daily and hydrochlorothiazide  12.5 mg  daily. CMP today  Orders: -     Comprehensive metabolic panel with GFR  Class 1 obesity due to excess calories without serious comorbidity with body mass index (BMI) of 34.0 to 34.9 in adult Assessment & Plan: Has been trying to make modifications to diet to lose weight  However she has not had much success. Activity related to chores and garden work. Encouraged increasing physical activity.   Orders: -     TSH  Hypertriglyceridemia Assessment & Plan: She is fasting this morning. Check Lipid panel today.   Orders: -     Lipid panel  Prediabetes Assessment & Plan: A1c was 6.0% in February.    Mixed hyperlipidemia Assessment & Plan: Tolerating atorvastatin  10 mg daily. Lipid panel today.   Orders: -     Lipid panel  Seasonal allergic rhinitis, unspecified trigger Assessment & Plan: Well controlled on zytrec, continue medication.       Return in about 6 months (around 04/17/2024) for chronic f/u, HTN.    Vanessa Saddler, MD

## 2023-10-16 NOTE — Assessment & Plan Note (Signed)
 Tolerating atorvastatin  10 mg daily. Lipid panel today

## 2023-10-16 NOTE — Assessment & Plan Note (Signed)
 Well controlled on zytrec, continue medication.

## 2023-10-16 NOTE — Assessment & Plan Note (Addendum)
 Has been trying to make modifications to diet to lose weight  However she has not had much success. Activity related to chores and garden work. Encouraged increasing physical activity.

## 2023-10-17 LAB — COMPREHENSIVE METABOLIC PANEL WITH GFR
ALT: 19 IU/L (ref 0–32)
AST: 21 IU/L (ref 0–40)
Albumin: 4.8 g/dL (ref 3.9–4.9)
Alkaline Phosphatase: 112 IU/L (ref 44–121)
BUN/Creatinine Ratio: 20 (ref 12–28)
BUN: 18 mg/dL (ref 8–27)
Bilirubin Total: 0.5 mg/dL (ref 0.0–1.2)
CO2: 25 mmol/L (ref 20–29)
Calcium: 10.2 mg/dL (ref 8.7–10.3)
Chloride: 98 mmol/L (ref 96–106)
Creatinine, Ser: 0.9 mg/dL (ref 0.57–1.00)
Globulin, Total: 2.8 g/dL (ref 1.5–4.5)
Glucose: 98 mg/dL (ref 70–99)
Potassium: 4.8 mmol/L (ref 3.5–5.2)
Sodium: 139 mmol/L (ref 134–144)
Total Protein: 7.6 g/dL (ref 6.0–8.5)
eGFR: 69 mL/min/1.73 (ref >59)

## 2023-10-17 LAB — LIPID PANEL
Chol/HDL Ratio: 2.5 ratio (ref 0.0–4.4)
Cholesterol, Total: 154 mg/dL (ref 100–199)
HDL: 61 mg/dL (ref >39)
LDL Chol Calc (NIH): 60 mg/dL (ref 0–99)
Triglycerides: 203 mg/dL — ABNORMAL HIGH (ref 0–149)
VLDL Cholesterol Cal: 33 mg/dL (ref 5–40)

## 2023-10-17 LAB — TSH: TSH: 2.42 u[IU]/mL (ref 0.450–4.500)

## 2023-10-19 ENCOUNTER — Ambulatory Visit: Payer: Self-pay | Admitting: Student

## 2023-10-27 ENCOUNTER — Ambulatory Visit
Admission: RE | Admit: 2023-10-27 | Discharge: 2023-10-27 | Disposition: A | Discharge status: Home / Self Care | Source: Ambulatory Visit | Attending: Student | Admitting: Student

## 2023-10-27 ENCOUNTER — Encounter: Payer: Self-pay | Admitting: Student

## 2023-10-27 DIAGNOSIS — Z1231 Encounter for screening mammogram for malignant neoplasm of breast: Secondary | ICD-10-CM | POA: Insufficient documentation

## 2023-11-11 ENCOUNTER — Other Ambulatory Visit: Payer: Self-pay

## 2023-11-11 DIAGNOSIS — C44219 Basal cell carcinoma of skin of left ear and external auricular canal: Secondary | ICD-10-CM | POA: Diagnosis not present

## 2023-11-11 DIAGNOSIS — E782 Mixed hyperlipidemia: Secondary | ICD-10-CM

## 2023-11-11 MED ORDER — ATORVASTATIN CALCIUM 10 MG PO TABS: 10.0000 mg | ORAL_TABLET | Freq: Every day | ORAL | 1 refills | Status: AC

## 2024-01-15 ENCOUNTER — Other Ambulatory Visit: Payer: Self-pay

## 2024-01-15 DIAGNOSIS — I10 Essential (primary) hypertension: Secondary | ICD-10-CM

## 2024-01-15 MED ORDER — HYDROCHLOROTHIAZIDE 12.5 MG PO TABS: 12.5000 mg | ORAL_TABLET | Freq: Every day | ORAL | 1 refills | Status: AC

## 2024-01-15 MED ORDER — AMLODIPINE BESY-BENAZEPRIL HCL 5-10 MG PO CAPS: 1.0000 | ORAL_CAPSULE | Freq: Every day | ORAL | 1 refills | Status: AC

## 2024-04-22 ENCOUNTER — Encounter: Payer: Self-pay | Admitting: Student

## 2024-04-22 ENCOUNTER — Ambulatory Visit: Admitting: Student

## 2024-04-22 VITALS — BP 130/80 | HR 81 | Temp 98.1°F | Ht 64.0 in | Wt 198.4 lb

## 2024-04-22 DIAGNOSIS — R7303 Prediabetes: Secondary | ICD-10-CM

## 2024-04-22 DIAGNOSIS — I10 Essential (primary) hypertension: Secondary | ICD-10-CM

## 2024-04-22 NOTE — Progress Notes (Unsigned)
 "  Established Patient Office Visit  Subjective   Patient ID: Vanessa Alvarado, female    DOB: 04-Sep-1950  Age: 74 y.o. MRN: [id number]  Chief Complaint  Patient presents with   Hypertension    Vanessa Alvarado is a 74 y.o. person with medical hx listed below who presents today for follow up of hypertension.   Patient Active Problem List   Diagnosis Date Noted   Obesity 10/16/2023   Hypertriglyceridemia 10/16/2023   Prediabetes 10/16/2023   Hyperglycemia 12/17/2020   Mixed hyperlipidemia 04/14/2019   Allergic rhinitis, seasonal 08/28/2014   Essential (primary) hypertension 09/05/2013      ROS Refer to HPI    Objective:     Outpatient Encounter Medications as of 04/22/2024  Medication Sig   amLODipine -benazepril  (LOTREL) 5-10 MG capsule Take 1 capsule by mouth daily.   atorvastatin  (LIPITOR) 10 MG tablet Take 1 tablet (10 mg total) by mouth daily.   calcium -vitamin D  (OSCAL WITH D) 500-200 MG-UNIT tablet Take 1 tablet by mouth daily with breakfast.   cetirizine (ZYRTEC) 10 MG tablet Take 10 mg by mouth daily.   hydrochlorothiazide  (HYDRODIURIL ) 12.5 MG tablet Take 1 tablet (12.5 mg total) by mouth daily.   No facility-administered encounter medications on file as of 04/22/2024.    Ht 5' 4 (1.626 m)   Wt 198 lb 6 oz (90 kg)   BMI 34.05 kg/m  BP Readings from Last 3 Encounters:  10/16/23 116/72  04/28/23 102/76  11/13/22 124/78    Physical Exam     10/16/2023    8:10 AM 08/12/2023    8:23 AM 04/28/2023    8:05 AM  Depression screen PHQ 2/9  Decreased Interest 0 0 0  Down, Depressed, Hopeless 0 0 0  PHQ - 2 Score 0 0 0  Altered sleeping 0 0   Tired, decreased energy 0 0   Change in appetite 0 0   Feeling bad or failure about yourself  0 0   Trouble concentrating 0 0   Moving slowly or fidgety/restless 0 0   Suicidal thoughts 0 0   PHQ-9 Score 0  0    Difficult doing work/chores Not difficult at all Not difficult at all      Data saved with a previous  flowsheet row definition       10/16/2023    8:10 AM 04/28/2023    8:05 AM 11/13/2022    9:38 AM 05/15/2022    9:31 AM  GAD 7 : Generalized Anxiety Score  Nervous, Anxious, on Edge 0  0  0  0   Control/stop worrying 0  0  0  0   Worry too much - different things 0  0  0  0   Trouble relaxing 0  0  0  0   Restless 0  0  0  0   Easily annoyed or irritable 0  0  0  0   Afraid - awful might happen 0  0  0  0   Total GAD 7 Score 0 0 0 0  Anxiety Difficulty Not difficult at all Not difficult at all Not difficult at all Not difficult at all     Data saved with a previous flowsheet row definition    No results found for any visits on 04/22/24.  Last CBC No results found for: WBC, HGB, HCT, MCV, MCH, RDW, PLT Last metabolic panel Lab Results  Component Value Date   GLUCOSE 98 10/16/2023   NA  139 10/16/2023   K 4.8 10/16/2023   CL 98 10/16/2023   CO2 25 10/16/2023   BUN 18 10/16/2023   CREATININE 0.90 10/16/2023   EGFR 69 10/16/2023   CALCIUM  10.2 10/16/2023   PHOS 3.3 04/28/2023   PROT 7.6 10/16/2023   ALBUMIN 4.8 10/16/2023   LABGLOB 2.8 10/16/2023   AGRATIO 1.9 11/12/2021   BILITOT 0.5 10/16/2023   ALKPHOS 112 10/16/2023   AST 21 10/16/2023   ALT 19 10/16/2023   Last lipids Lab Results  Component Value Date   CHOL 154 10/16/2023   HDL 61 10/16/2023   LDLCALC 60 10/16/2023   TRIG 203 (H) 10/16/2023   CHOLHDL 2.5 10/16/2023   Last hemoglobin A1c Lab Results  Component Value Date   HGBA1C 6.0 (H) 04/28/2023   Last thyroid  functions Lab Results  Component Value Date   TSH 2.420 10/16/2023      The 10-year ASCVD risk score (Arnett DK, et al., 2019) is: 18.6%    Assessment & Plan:  There are no diagnoses linked to this encounter.   No follow-ups on file.    Harlene Saddler, MD "

## 2024-04-22 NOTE — Assessment & Plan Note (Signed)
 BP Today is 130/80, currently on hydrochlorothiazide  12.5 mg and  amlodipine  5-10 mg daily. Denies dizziness or swelling.

## 2024-04-22 NOTE — Assessment & Plan Note (Signed)
 Check A1c today.

## 2024-08-17 ENCOUNTER — Ambulatory Visit

## 2024-10-20 ENCOUNTER — Encounter: Admitting: Student
